# Patient Record
Sex: Male | Born: 1975 | Race: White | Hispanic: No | Smoking: Current every day smoker
Health system: Southern US, Community
[De-identification: ages and names within clinical notes are randomized; demographics above are authoritative.]

## PROBLEM LIST (undated history)

## (undated) DIAGNOSIS — F1410 Cocaine abuse, uncomplicated: Secondary | ICD-10-CM

## (undated) DIAGNOSIS — I10 Essential (primary) hypertension: Secondary | ICD-10-CM

## (undated) DIAGNOSIS — F1910 Other psychoactive substance abuse, uncomplicated: Secondary | ICD-10-CM

## (undated) HISTORY — PX: NO PAST SURGERIES: SHX2092

---
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
## 2001-01-15 HISTORY — PX: WRIST SURGERY: SHX841

## 2011-12-09 ENCOUNTER — Emergency Department: Payer: Self-pay | Admitting: Emergency Medicine

## 2011-12-09 LAB — BASIC METABOLIC PANEL
Anion Gap: 7 (ref 7–16)
Calcium, Total: 9.2 mg/dL (ref 8.5–10.1)
Chloride: 106 mmol/L (ref 98–107)
Co2: 28 mmol/L (ref 21–32)
Creatinine: 1.36 mg/dL — ABNORMAL HIGH (ref 0.60–1.30)
EGFR (African American): >60
EGFR (Non-African Amer.): >60
Glucose: 84 mg/dL (ref 65–99)
Osmolality: 281 (ref 275–301)

## 2011-12-09 LAB — CBC WITH DIFFERENTIAL/PLATELET
Basophil #: 0.1 10*3/uL (ref 0.0–0.1)
Basophil %: 0.5 %
Eosinophil #: 0 10*3/uL (ref 0.0–0.7)
Eosinophil %: 0.1 %
HCT: 43.8 % (ref 40.0–52.0)
HGB: 14.9 g/dL (ref 13.0–18.0)
Lymphocyte %: 16 %
MCH: 30.5 pg (ref 26.0–34.0)
MCHC: 34 g/dL (ref 32.0–36.0)
Neutrophil #: 8.5 10*3/uL — ABNORMAL HIGH (ref 1.4–6.5)
Neutrophil %: 78.6 %
RBC: 4.88 10*6/uL (ref 4.40–5.90)
RDW: 12.8 % (ref 11.5–14.5)
WBC: 10.8 10*3/uL — ABNORMAL HIGH (ref 3.8–10.6)

## 2011-12-09 LAB — TROPONIN I: Troponin-I: <0.02 ng/mL

## 2014-12-30 ENCOUNTER — Emergency Department
Admission: EM | Admit: 2014-12-30 | Discharge: 2014-12-30 | Disposition: A | Discharge status: Home / Self Care | Payer: BLUE CROSS/BLUE SHIELD | Attending: Emergency Medicine | Admitting: Emergency Medicine

## 2014-12-30 DIAGNOSIS — K029 Dental caries, unspecified: Secondary | ICD-10-CM | POA: Diagnosis not present

## 2014-12-30 DIAGNOSIS — K032 Erosion of teeth: Secondary | ICD-10-CM | POA: Diagnosis not present

## 2014-12-30 DIAGNOSIS — K0889 Other specified disorders of teeth and supporting structures: Secondary | ICD-10-CM | POA: Diagnosis not present

## 2014-12-30 MED ORDER — OXYCODONE-ACETAMINOPHEN 5-325 MG PO TABS: 1.0000 | ORAL_TABLET | Freq: Four times a day (QID) | ORAL | Status: DC | PRN

## 2014-12-30 MED ORDER — PENICILLIN V POTASSIUM 500 MG PO TABS: 500.0000 mg | ORAL_TABLET | Freq: Four times a day (QID) | ORAL | Status: DC

## 2014-12-30 MED ORDER — PENICILLIN V POTASSIUM 250 MG PO TABS
500.0000 mg | ORAL_TABLET | Freq: Once | ORAL | Status: AC
  Administered 2014-12-30: 500 mg via ORAL
  Filled 2014-12-30: qty 2

## 2014-12-30 MED ORDER — BUPIVACAINE HCL (PF) 0.25 % IJ SOLN
INTRAMUSCULAR | Status: AC
  Administered 2014-12-30: 30 mL
  Filled 2014-12-30: qty 30

## 2014-12-30 MED ORDER — BUPIVACAINE HCL 0.25 % IJ SOLN: 30.0000 mL | Freq: Once | INTRAMUSCULAR | Status: DC

## 2014-12-30 MED ORDER — PENICILLIN V POTASSIUM 250 MG PO TABS: 500.0000 mg | ORAL_TABLET | Freq: Four times a day (QID) | ORAL | Status: DC

## 2014-12-30 NOTE — ED Notes (Signed)
Pt in with co toothache since yest  

## 2014-12-30 NOTE — ED Provider Notes (Signed)
[facility] Emergency Department Provider Note  ____________________________________________  Time seen: Approximately 645 AM  I have reviewed the triage vital signs and the nursing notes.   HISTORY  Chief Complaint Dental Pain    HPI Karl Adkins is a 39 y.o. male with a history of substance abuse, clean for 2 years who is presenting with dental pain. He says that at 5 PM last night he began feeling pain to his left posterior mandible while chewing which has progressed to severe 10 out of 10 pain. He says that he has had dental pain in the past and is "too embarrassed to follow-up with the dentist." He says is been embarrassed because of his poor dentition. He says that he was a crack cocaine user up to about 2 years ago but has been clean since. He says he is medical as well as dental insurance to follow-up.   No past medical history on file.  There are no active problems to display for this patient.   No past surgical history on file.  No current outpatient prescriptions on file.  Allergies Review of patient's allergies indicates no known allergies.  No family history on file.  Social History Social History  Substance Use Topics  . Smoking status: Not on file  . Smokeless tobacco: Not on file  . Alcohol Use: Not on file    Review of Systems Constitutional: No fever/chills Eyes: No visual changes. ENT: No sore throat. Cardiovascular: Denies chest pain. Respiratory: Denies shortness of breath. Gastrointestinal: No abdominal pain.  No nausea, no vomiting.  No diarrhea.  No constipation. Genitourinary: Negative for dysuria. Musculoskeletal: Negative for back pain. Skin: Negative for rash. Neurological: Negative for headaches, focal weakness or numbness.  10-point ROS otherwise negative.  ____________________________________________   PHYSICAL EXAM:  VITAL SIGNS: ED Triage Vitals  Enc Vitals Group     BP 12/30/14 0533 193/137  mmHg     Pulse Rate 12/30/14 0533 104     Resp 12/30/14 0533 18     Temp 12/30/14 0533 97.7 F (36.5 C)     Temp Source 12/30/14 0533 Oral     SpO2 12/30/14 0533 99 %     Weight 12/30/14 0533 160 lb (72.576 kg)     Height 12/30/14 0533 5\' 11"  (1.803 m)     Head Cir --      Peak Flow --      Pain Score 12/30/14 0534 10     Pain Loc --      Pain Edu? --      Excl. in GC? --     Constitutional: Alert and oriented. Mild amount of distress secondary to the pain in his mouth.  Eyes: Conjunctivae are normal. PERRL. EOMI. Head: Atraumatic. Nose: No congestion/rhinnorhea. Mouth/Throat: Mucous membranes are moist.  Poor dentition with loss of many of the posterior molars both maxillary and mandibular. He has diffuse erosion of his teeth otherwise. There is no swelling or fluctuance or pus drainage visualized. He is points specifically to tooth 21 and 22 where there is tenderness. There is severe erosion along the gumline here. Neck: No stridor.   Cardiovascular: Normal rate, regular rhythm. Grossly normal heart sounds.  Good peripheral circulation. Respiratory: Normal respiratory effort.  No retractions. Lungs CTAB. Gastrointestinal: Soft and nontender. No distention.  Musculoskeletal: No lower extremity tenderness nor edema.  No joint effusions. Neurologic:  Normal speech and language. No gross focal neurologic deficits are appreciated. No gait instability. Skin:  Skin is  warm, dry and intact. No rash noted. Psychiatric: Mood and affect are normal. Speech and behavior are normal.  ____________________________________________   LABS (all labs ordered are listed, but only abnormal results are displayed)  Labs Reviewed - No data to display ____________________________________________  EKG   ____________________________________________  RADIOLOGY   ____________________________________________   PROCEDURES  Periodontal Exam NERVE BLOCK Performed by: Arelia Longest Consent: Verbal consent obtained. Required items: required blood products, implants, devices, and special equipment available Time out: Immediately prior to procedure a "time out" was called to verify the correct patient, procedure, equipment, support staff and site/side marked as required.  Indication: Dental pain  Nerve block body site: Inferior alveolar as well as mental   Preparation: Intraoral  Needle gauge: 25 G Location technique: anatomical landmarks  Local anesthetic: 0.25% Marcaine.   Anesthetic total: 2.5 mL's in the inferior alveolar block and then 1.28ml to the mental  Outcome: pain improved Patient tolerance: Patient tolerated the procedure well with no immediate complications.   ____________________________________________   INITIAL IMPRESSION / ASSESSMENT AND PLAN / ED COURSE  Pertinent labs & imaging results that were available during my care of the patient were reviewed by me and considered in my medical decision making (see chart for details).  Patient had almost immediate improvement of his pain after nerve blocks.  He is aware that he'll need to follow-up with a dentist for definitive care. He'll be discharged with pain meds as well as antibiotics. ____________________________________________   FINAL CLINICAL IMPRESSION(S) / ED DIAGNOSES  Pain secondary to dental caries.    Myrna Blazer, MD 12/30/14 [phone]

## 2015-05-02 ENCOUNTER — Emergency Department
Admission: EM | Admit: 2015-05-02 | Discharge: 2015-05-02 | Disposition: A | Discharge status: Home / Self Care | Payer: BLUE CROSS/BLUE SHIELD | Attending: Emergency Medicine | Admitting: Emergency Medicine

## 2015-05-02 ENCOUNTER — Encounter: Payer: Self-pay | Admitting: Emergency Medicine

## 2015-05-02 DIAGNOSIS — F17200 Nicotine dependence, unspecified, uncomplicated: Secondary | ICD-10-CM | POA: Diagnosis not present

## 2015-05-02 DIAGNOSIS — K029 Dental caries, unspecified: Secondary | ICD-10-CM | POA: Diagnosis not present

## 2015-05-02 DIAGNOSIS — K0889 Other specified disorders of teeth and supporting structures: Secondary | ICD-10-CM | POA: Insufficient documentation

## 2015-05-02 MED ORDER — PENICILLIN V POTASSIUM 500 MG PO TABS: 500.0000 mg | ORAL_TABLET | Freq: Four times a day (QID) | ORAL | Status: DC

## 2015-05-02 MED ORDER — OXYCODONE-ACETAMINOPHEN 5-325 MG PO TABS: 1.0000 | ORAL_TABLET | ORAL | Status: DC | PRN

## 2015-05-02 NOTE — Discharge Instructions (Signed)
Dental Caries °Dental caries is tooth decay. This decay can cause a hole in teeth (cavity) that can get bigger and deeper over time. °HOME CARE °· Brush and floss your teeth. Do this at least two times a day. °· Use a fluoride toothpaste. °· Use a mouth rinse if told by your dentist or doctor. °· Eat less sugary and starchy foods. Drink less sugary drinks. °· Avoid snacking often on sugary and starchy foods. Avoid sipping often on sugary drinks. °· Keep regular checkups and cleanings with your dentist. °· Use fluoride supplements if told by your dentist or doctor. °· Allow fluoride to be applied to teeth if told by your dentist or doctor. °  °This information is not intended to replace advice given to you by your health care provider. Make sure you discuss any questions you have with your health care provider. °  °Document Released: 10/11/2007 Document Revised: 01/22/2014 Document Reviewed: 01/04/2012 °Elsevier Interactive Patient Education ©2016 Elsevier Inc. ° °

## 2015-05-02 NOTE — ED Provider Notes (Signed)
[facility] Emergency Department Provider Note  ____________________________________________  Time seen: Approximately 8:56 PM  I have reviewed the triage vital signs and the nursing notes.   HISTORY  Chief Complaint Dental Pain    HPI Karl Adkins is a 40 y.o. male complaining of dental pain left upper and lower molars. Patient has a history of devitalized teeth which need to be extracted. Patient states pain and swelling started today. Patient stated it would be 8 days before he can see a dentist. Patient rates his pain as a 10 over 10. Patient described a pain as "sharp". No palliative measures for this complaint.  History reviewed. No pertinent past medical history.  There are no active problems to display for this patient.   History reviewed. No pertinent past surgical history.  Current Outpatient Rx  Name  Route  Sig  Dispense  Refill  . oxyCODONE-acetaminophen (ROXICET) 5-325 MG tablet   Oral   Take 1-2 tablets by mouth every 6 (six) hours as needed.   15 tablet   0   . oxyCODONE-acetaminophen (ROXICET) 5-325 MG tablet   Oral   Take 1 tablet by mouth every 4 (four) hours as needed for severe pain.   30 tablet   0   . penicillin v potassium (VEETID) 500 MG tablet   Oral   Take 1 tablet (500 mg total) by mouth 4 (four) times daily.   28 tablet   0   . penicillin v potassium (VEETID) 500 MG tablet   Oral   Take 1 tablet (500 mg total) by mouth 4 (four) times daily.   40 tablet   0     Allergies Review of patient's allergies indicates no known allergies.  No family history on file.  Social History Social History  Substance Use Topics  . Smoking status: Current Every Day Smoker  . Smokeless tobacco: None  . Alcohol Use: None    Review of Systems Constitutional: No fever/chills Eyes: No visual changes. ENT: No sore throat. Left upper and lower tooth pain  Cardiovascular: Denies chest pain. Respiratory: Denies shortness  of breath. Gastrointestinal: No abdominal pain.  No nausea, no vomiting.  No diarrhea.  No constipation. Genitourinary: Negative for dysuria. Musculoskeletal: Negative for back pain. Skin: Negative for rash. Neurological: Negative for headaches, focal weakness or numbness.   ____________________________________________   PHYSICAL EXAM:  VITAL SIGNS: ED Triage Vitals  Enc Vitals Group     BP 05/02/15 2025 142/93 mmHg     Pulse Rate 05/02/15 2025 84     Resp 05/02/15 2025 20     Temp 05/02/15 2025 98.9 F (37.2 C)     Temp Source 05/02/15 2025 Oral     SpO2 05/02/15 2025 100 %     Weight --      Height --      Head Cir --      Peak Flow --      Pain Score 05/02/15 2025 10     Pain Loc --      Pain Edu? --      Excl. in GC? --     Constitutional: Alert and oriented. Well appearing and in no acute distress. Eyes: Conjunctivae are normal. PERRL. EOMI. Head: Atraumatic. Nose: No congestion/rhinnorhea. Mouth/Throat: Mucous membranes are moist.  Oropharynx non-erythematous.Edematous gingiva with devitalized upper and lower molars  Neck: No stridor. No cervical spine tenderness to palpation. Hematological/Lymphatic/Immunilogical: No cervical lymphadenopathy. Cardiovascular: Normal rate, regular rhythm. Grossly normal heart sounds.  Good peripheral circulation. Respiratory: Normal respiratory effort.  No retractions. Lungs CTAB. Gastrointestinal: Soft and nontender. No distention. No abdominal bruits. No CVA tenderness. Musculoskeletal: No lower extremity tenderness nor edema.  No joint effusions. Neurologic:  Normal speech and language. No gross focal neurologic deficits are appreciated. No gait instability. Skin:  Skin is warm, dry and intact. No rash noted. Psychiatric: Mood and affect are normal. Speech and behavior are normal.  ____________________________________________   LABS (all labs ordered are listed, but only abnormal results are displayed)  Labs Reviewed - No  data to display ____________________________________________  EKG   ____________________________________________  RADIOLOGY   ____________________________________________   PROCEDURES  Procedure(s) performed: None  Critical Care performed: No  ____________________________________________   INITIAL IMPRESSION / ASSESSMENT AND PLAN / ED COURSE  Pertinent labs & imaging results that were available during my care of the patient were reviewed by me and considered in my medical decision making (see chart for details).  Dental pain. Patient given a list of dental clinics to follow up. Patient given a prescription for Pen-Vee K and Percocets. ____________________________________________   FINAL CLINICAL IMPRESSION(S) / ED DIAGNOSES  Final diagnoses:  Pain due to dental caries      Joni ReiningRonald K Smith, PA-C 05/02/15 2107  Jennye MoccasinBrian S Quigley, MD 05/02/15 2238

## 2015-05-02 NOTE — ED Notes (Signed)
Pt presents to ED via POV with c/o of left side dental pain. Pt states he has noticed presenting sx since today with an infection/abcess forming a couple weeks ago. Pt states pain worsening this afternoon. Pt states pain located to left upper teeth/gums. Pt denies abnormal discharge from affected area.

## 2015-05-22 ENCOUNTER — Emergency Department
Admission: EM | Admit: 2015-05-22 | Discharge: 2015-05-22 | Disposition: A | Discharge status: Home / Self Care | Payer: BLUE CROSS/BLUE SHIELD | Attending: Student | Admitting: Student

## 2015-05-22 ENCOUNTER — Encounter: Payer: Self-pay | Admitting: Emergency Medicine

## 2015-05-22 DIAGNOSIS — K047 Periapical abscess without sinus: Secondary | ICD-10-CM | POA: Insufficient documentation

## 2015-05-22 DIAGNOSIS — K0889 Other specified disorders of teeth and supporting structures: Secondary | ICD-10-CM | POA: Diagnosis present

## 2015-05-22 DIAGNOSIS — F17200 Nicotine dependence, unspecified, uncomplicated: Secondary | ICD-10-CM | POA: Insufficient documentation

## 2015-05-22 MED ORDER — PENICILLIN V POTASSIUM 500 MG PO TABS: 500.0000 mg | ORAL_TABLET | Freq: Four times a day (QID) | ORAL | Status: DC

## 2015-05-22 MED ORDER — IBUPROFEN 600 MG PO TABS: 600.0000 mg | ORAL_TABLET | Freq: Three times a day (TID) | ORAL | Status: DC | PRN

## 2015-05-22 NOTE — ED Provider Notes (Signed)
[facility] Emergency Department Provider Note   ____________________________________________  Time seen: Approximately 1:29 PM  I have reviewed the triage vital signs and the nursing notes.   HISTORY  Chief Complaint Dental Pain   HPI Karl Adkins is a 40 y.o. male is here with complaint of dental pain for a couple days. He states that 3 weeks ago he had one tooth extracted but has "a mouth full of bad teeth". He has not called a dentist to make an appointment. He states that do to financial reasons he tries to have "one tooth on a year". He denies any abdominal pain or peptic ulcer disease. Currently he rates his pain as a 9/10.He denies any fever or chills.   History reviewed. No pertinent past medical history.  There are no active problems to display for this patient.   History reviewed. No pertinent past surgical history.  Current Outpatient Rx  Name  Route  Sig  Dispense  Refill  . ibuprofen (ADVIL,MOTRIN) 600 MG tablet   Oral   Take 1 tablet (600 mg total) by mouth every 8 (eight) hours as needed.   30 tablet   0   . penicillin v potassium (VEETID) 500 MG tablet   Oral   Take 1 tablet (500 mg total) by mouth 4 (four) times daily.   40 tablet   0     Allergies Review of patient's allergies indicates no known allergies.  No family history on file.  Social History Social History  Substance Use Topics  . Smoking status: Current Every Day Smoker  . Smokeless tobacco: None  . Alcohol Use: None    Review of Systems Constitutional: No fever/chills ENT: Positive dental pain. Cardiovascular: Denies chest pain. Respiratory: Denies shortness of breath. Gastrointestinal:  No nausea, no vomiting.   Skin: Negative for rash. Neurological: Negative for headaches  10-point ROS otherwise negative.  ____________________________________________   PHYSICAL EXAM:  VITAL SIGNS: ED Triage Vitals  Enc Vitals Group     BP 05/22/15  1314 143/91 mmHg     Pulse Rate 05/22/15 1314 93     Resp 05/22/15 1314 16     Temp 05/22/15 1314 98.2 F (36.8 C)     Temp Source 05/22/15 1314 Oral     SpO2 05/22/15 1314 99 %     Weight 05/22/15 1314 165 lb (74.844 kg)     Height 05/22/15 1314  (1.803 m)     Head Cir --      Peak Flow --      Pain Score 05/22/15 1314 9     Pain Loc --      Pain Edu? --      Excl. in GC? --     Constitutional: Alert and oriented. Well appearing and in no acute distress. Eyes: Conjunctivae are normal. PERRL. EOMI. Head: Atraumatic. Nose: No congestion/rhinnorhea. Mouth/Throat: Mucous membranes are moist.  Oropharynx non-erythematous.Left upper gum anteriorly there is a small pustule present which is currently not draining. Teeth are in extremely poor hygiene and repair. There are multiple teeth extractions noted. Neck: No stridor.   Hematological/Lymphatic/Immunilogical: No cervical lymphadenopathy. Cardiovascular: Normal rate, regular rhythm. Grossly normal heart sounds.  Good peripheral circulation. Respiratory: Normal respiratory effort.  No retractions. Lungs CTAB. Musculoskeletal: Moves upper and lower extremities without any difficulty and normal gait was noted. Neurologic:  Normal speech and language. No gross focal neurologic deficits are appreciated. No gait instability. Skin:  Skin is warm, dry and intact.  No rash noted. Psychiatric: Mood and affect are normal. Speech and behavior are normal.  ____________________________________________   LABS (all labs ordered are listed, but only abnormal results are displayed)  Labs Reviewed - No data to display   PROCEDURES  Procedure(s) performed: None  Critical Care performed: No  ____________________________________________   INITIAL IMPRESSION / ASSESSMENT AND PLAN / ED COURSE  Pertinent labs & imaging results that were available during my care of the patient were reviewed by me and considered in my medical decision making  (see chart for details).  Patient was given Pen-Vee K 500 mg 4 times a day for 10 days and ibuprofen as needed for pain. He is also given information about local dental clinics that he may also qualify for. ____________________________________________   FINAL CLINICAL IMPRESSION(S) / ED DIAGNOSES  Final diagnoses:  Dental abscess      NEW MEDICATIONS STARTED DURING THIS VISIT:  Discharge Medication List as of 05/22/2015  2:06 PM    START taking these medications   Details  ibuprofen (ADVIL,MOTRIN) 600 MG tablet Take 1 tablet (600 mg total) by mouth every 8 (eight) hours as needed., Starting 05/22/2015, Until Discontinued, Print         Note:  This document was prepared using Dragon voice recognition software and may include unintentional dictation errors.    Tommi Rumpshonda L Summers, PA-C 05/22/15 1450  Gayla DossEryka A Gayle, MD 05/22/15 [phone]

## 2015-05-22 NOTE — ED Notes (Signed)
Tooth pain for couple of day pain to left upper gumline

## 2015-05-22 NOTE — Discharge Instructions (Signed)
Dental Abscess A dental abscess is pus in or around a tooth. HOME CARE  Take medicines only as told by your dentist.  If you were prescribed antibiotic medicine, finish all of it even if you start to feel better.  Rinse your mouth (gargle) often with salt water.  Do not drive or use heavy machinery, like a lawn mower, while taking pain medicine.  Do not apply heat to the outside of your mouth.  Keep all follow-up visits as told by your dentist. This is important. GET HELP IF:  Your pain is worse, and medicine does not help. GET HELP RIGHT AWAY IF:  You have a fever or chills.  Your symptoms suddenly get worse.  You have a very bad headache.  You have problems breathing or swallowing.  You have trouble opening your mouth.  You have puffiness (swelling) in your neck or around your eye.   This information is not intended to replace advice given to you by your health care provider. Make sure you discuss any questions you have with your health care provider.   Document Released: 05/18/2014 Document Reviewed: 05/18/2014 Elsevier Interactive Patient Education [employer]   Call your dentist or one of the dental clinics listed below Take medication as directed.   OPTIONS FOR DENTAL FOLLOW UP CARE  [place] [facility] - Local Safety Net Dental Clinics [email or web address]   [facility] [phone])  [facility]arl Ma [phone])  [place] [phone])  [facility] [phone])  [facility] [phone]) This clinic caters to the indigent population and is on a lottery system. Location: [employer] of Dentistry, [employer], [address], [place] Clinic Hours: Wednesdays from 6pm - 9pm, patients seen by a lottery system. For dates, call or go to [email or web address] Services: Cleanings,  fillings and simple extractions. Payment Options: DENTAL WORK IS FREE OF CHARGE. Bring proof of income or support. Best way to get seen: Arrive at 5:15 pm - this is a lottery, NOT first come/first serve, so arriving earlier will not increase your chances of being seen.     [facility] [phone] Select option 1 for emergencies   Location: [facility] of Dentistry, [place], [address] [place] Clinic Hours: No walk-ins accepted - call the day before to schedule an appointment. Check in times are 9:30 am and 1:30 pm. Services: Simple extractions, temporary fillings, pulpectomy/pulp debridement, uncomplicated abscess drainage. Payment Options: PAYMENT IS DUE AT THE TIME OF SERVICE.  Fee is usually $100-200, additional surgical procedures (e.g. abscess drainage) may be extra. Cash, checks, Visa/MasterCard accepted.  Can file Medicaid if patient is covered for dental - patient should call case worker to check. No discount for [facility] patients. Best way to get seen: MUST call the day before and get onto the schedule. Can usually be seen the next 1-2 days. No walk-ins accepted.     [facility] [phone]   Location: [facility], [address] [place] Clinic Hours: M, W, Th, F 8am or 1:30pm, Tues 9a or 1:30 - first come/first served. Services: Simple extractions, temporary fillings, uncomplicated abscess drainage.  You do not need to be an [facility] resident. Payment Options: PAYMENT IS DUE AT THE TIME OF SERVICE. Dental insurance, otherwise sliding scale - bring proof of income or support. Depending on income and treatment needed, cost is usually $50-200. Best way to get seen: Arrive early  as it is first come/first served.     [facility] [phone]   Location: [address] Clinic Hours: Mon-Thu 8a-5p Services: Most basic dental  services including extractions and fillings. Payment Options: PAYMENT IS DUE AT THE TIME OF SERVICE. Sliding scale, up to 50% off - bring proof if income or support. Medicaid with dental option accepted. Best way to get seen: Call to schedule an appointment, can usually be seen within 2 weeks OR they will try to see walk-ins - show up at 8a or 2p (you may have to wait).     [facility] [phone] [place] RESIDENTS ONLY   Location: [facility], [address], [place], [place] [phone] Clinic Hours: By appointment only. Monday - Thursday 8am-5pm, Friday 8am-12pm Services: Cleanings, fillings, extractions. Payment Options: PAYMENT IS DUE AT THE TIME OF SERVICE. Cash, Visa or MasterCard. Sliding scale - $30 minimum per service. Best way to get seen: Come in to office, complete packet and make an appointment - need proof of income or support monies for each household member and proof of [facility] [place] residence. Usually takes about a month to get in.     [facility] [phone]   Location: [address] [facility] Hours: Walk-in Urgent Care Dental Services are offered Monday-Friday mornings only. The numbers of emergencies accepted daily is limited to the number of providers available. Maximum 15 - Mondays, Wednesdays & Thursdays Maximum 10 - Tuesdays & Fridays Services: You do not need to be a [facility] [place] [facility] [place] resident to be seen for a dental emergency. Emergencies are defined as pain, swelling, abnormal bleeding, or dental trauma. Walkins will receive x-rays if needed. NOTE: Dental cleaning is not an emergency. Payment Options: PAYMENT IS DUE AT THE TIME OF SERVICE. Minimum co-pay is $40.00 for uninsured patients. Minimum co-pay is $3.00 for Medicaid with dental coverage. Dental Insurance is accepted and must be presented at time of visit. Medicare does not cover dental. Forms of payment: Cash,  credit card, checks. Best way to get seen: If not previously registered with the clinic, walk-in dental registration begins at 7:15 am and is on a first come/first serve basis. If previously registered with the clinic, call to make an appointment.     [facility] [phone] [place] RESIDENTS ONLY   Location: [address], [place], [place] Clinic Hours: Mon-Thu 10a-2p Services: Extractions only! Payment Options: FREE (donations accepted) - bring proof of income or support Best way to get seen: Call and schedule an appointment OR come at 8am on the 1st Monday of every month (except for holidays) when it is first come/first served.     [facility] [phone]   Location: [address], [place] Clinic Hours: Friday mornings Services, Payment Options, Best way to get seen: Call for info

## 2015-05-22 NOTE — ED Notes (Signed)
Patient to ER for pain to left upper dentition.

## 2016-01-16 ENCOUNTER — Emergency Department: Payer: Self-pay

## 2016-01-16 ENCOUNTER — Inpatient Hospital Stay
Admission: EM | Admit: 2016-01-16 | Discharge: 2016-01-21 | DRG: 683 | Disposition: A | Discharge status: Home / Self Care | Payer: Self-pay | Attending: Internal Medicine | Admitting: Internal Medicine

## 2016-01-16 ENCOUNTER — Encounter: Payer: Self-pay | Admitting: Emergency Medicine

## 2016-01-16 DIAGNOSIS — N170 Acute kidney failure with tubular necrosis: Principal | ICD-10-CM | POA: Diagnosis present

## 2016-01-16 DIAGNOSIS — I10 Essential (primary) hypertension: Secondary | ICD-10-CM | POA: Diagnosis present

## 2016-01-16 DIAGNOSIS — M6282 Rhabdomyolysis: Secondary | ICD-10-CM | POA: Diagnosis present

## 2016-01-16 DIAGNOSIS — F1290 Cannabis use, unspecified, uncomplicated: Secondary | ICD-10-CM | POA: Diagnosis present

## 2016-01-16 DIAGNOSIS — E860 Dehydration: Secondary | ICD-10-CM | POA: Diagnosis present

## 2016-01-16 DIAGNOSIS — E872 Acidosis, unspecified: Secondary | ICD-10-CM | POA: Diagnosis present

## 2016-01-16 DIAGNOSIS — N179 Acute kidney failure, unspecified: Secondary | ICD-10-CM | POA: Diagnosis present

## 2016-01-16 DIAGNOSIS — R4182 Altered mental status, unspecified: Secondary | ICD-10-CM | POA: Diagnosis present

## 2016-01-16 DIAGNOSIS — S0081XA Abrasion of other part of head, initial encounter: Secondary | ICD-10-CM | POA: Diagnosis present

## 2016-01-16 DIAGNOSIS — F17200 Nicotine dependence, unspecified, uncomplicated: Secondary | ICD-10-CM | POA: Diagnosis present

## 2016-01-16 DIAGNOSIS — Y92512 Supermarket, store or market as the place of occurrence of the external cause: Secondary | ICD-10-CM

## 2016-01-16 DIAGNOSIS — Y9302 Activity, running: Secondary | ICD-10-CM | POA: Diagnosis present

## 2016-01-16 DIAGNOSIS — F1410 Cocaine abuse, uncomplicated: Secondary | ICD-10-CM | POA: Diagnosis present

## 2016-01-16 DIAGNOSIS — R000 Tachycardia, unspecified: Secondary | ICD-10-CM | POA: Diagnosis present

## 2016-01-16 DIAGNOSIS — W010XXA Fall on same level from slipping, tripping and stumbling without subsequent striking against object, initial encounter: Secondary | ICD-10-CM | POA: Diagnosis present

## 2016-01-16 HISTORY — DX: Cocaine abuse, uncomplicated: F14.10

## 2016-01-16 HISTORY — DX: Essential (primary) hypertension: I10

## 2016-01-16 LAB — ACETAMINOPHEN LEVEL

## 2016-01-16 LAB — URINE DRUG SCREEN, QUALITATIVE (ARMC ONLY)
AMPHETAMINES, UR SCREEN: NOT DETECTED
BENZODIAZEPINE, UR SCRN: NOT DETECTED
Barbiturates, Ur Screen: NOT DETECTED
Cannabinoid 50 Ng, Ur ~~LOC~~: POSITIVE — AB
Cocaine Metabolite,Ur ~~LOC~~: POSITIVE — AB
MDMA (Ecstasy)Ur Screen: NOT DETECTED
METHADONE SCREEN, URINE: NOT DETECTED
Opiate, Ur Screen: NOT DETECTED
Phencyclidine (PCP) Ur S: NOT DETECTED
Tricyclic, Ur Screen: NOT DETECTED

## 2016-01-16 LAB — CBC WITH DIFFERENTIAL/PLATELET
BASOS PCT: 1 %
Basophils Absolute: 0.1 10*3/uL (ref 0–0.1)
EOS ABS: 0 10*3/uL (ref 0–0.7)
Eosinophils Relative: 0 %
HCT: 55.1 % — ABNORMAL HIGH (ref 40.0–52.0)
HEMOGLOBIN: 17.5 g/dL (ref 13.0–18.0)
Lymphocytes Relative: 14 %
Lymphs Abs: 4.4 10*3/uL — ABNORMAL HIGH (ref 1.0–3.6)
MCH: 31.2 pg (ref 26.0–34.0)
MCHC: 31.8 g/dL — AB (ref 32.0–36.0)
MCV: 97.9 fL (ref 80.0–100.0)
MONOS PCT: 6 %
Monocytes Absolute: 1.7 10*3/uL — ABNORMAL HIGH (ref 0.2–1.0)
NEUTROS PCT: 79 %
Neutro Abs: 24.7 10*3/uL — ABNORMAL HIGH (ref 1.4–6.5)
Platelets: 351 10*3/uL (ref 150–440)
RBC: 5.62 MIL/uL (ref 4.40–5.90)
RDW: 13 % (ref 11.5–14.5)
WBC: 31 10*3/uL — AB (ref 3.8–10.6)

## 2016-01-16 LAB — TROPONIN I

## 2016-01-16 LAB — CK: Total CK: 916 U/L — ABNORMAL HIGH (ref 49–397)

## 2016-01-16 LAB — BASIC METABOLIC PANEL
Anion gap: 6 (ref 5–15)
BUN: 14 mg/dL (ref 6–20)
CO2: 22 mmol/L (ref 22–32)
Calcium: 7.7 mg/dL — ABNORMAL LOW (ref 8.9–10.3)
Chloride: 109 mmol/L (ref 101–111)
Creatinine, Ser: 1.29 mg/dL — ABNORMAL HIGH (ref 0.61–1.24)
GFR calc Af Amer: >60 mL/min (ref >60)
GLUCOSE: 97 mg/dL (ref 65–99)
POTASSIUM: 3.5 mmol/L (ref 3.5–5.1)
Sodium: 137 mmol/L (ref 135–145)

## 2016-01-16 LAB — LACTIC ACID, PLASMA: LACTIC ACID, VENOUS: 3.3 mmol/L — AB (ref 0.5–1.9)

## 2016-01-16 LAB — ETHANOL: Alcohol, Ethyl (B): <5 mg/dL (ref <5)

## 2016-01-16 LAB — LACTATE DEHYDROGENASE: LDH: 315 U/L — AB (ref 98–192)

## 2016-01-16 LAB — LIPASE, BLOOD: Lipase: 15 U/L (ref 11–51)

## 2016-01-16 LAB — SALICYLATE LEVEL

## 2016-01-16 MED ORDER — STERILE WATER FOR INJECTION IV SOLN
Freq: Once | INTRAVENOUS | Status: AC
  Administered 2016-01-16: 22:00:00 via INTRAVENOUS
  Filled 2016-01-16: qty 850

## 2016-01-16 MED ORDER — SODIUM CHLORIDE 0.9 % IV BOLUS (SEPSIS)
1000.0000 mL | Freq: Once | INTRAVENOUS | Status: AC
  Administered 2016-01-16: 1000 mL via INTRAVENOUS

## 2016-01-16 MED ORDER — LORAZEPAM 2 MG/ML IJ SOLN
1.5000 mg | Freq: Once | INTRAMUSCULAR | Status: AC
  Administered 2016-01-16: 1.5 mg via INTRAVENOUS
  Filled 2016-01-16: qty 1
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
## 2016-01-16 NOTE — ED Notes (Signed)
Pt arrived with abrasion on forehead, hands/fingers, and knees. Pt admits smoking crack today.

## 2016-01-16 NOTE — ED Notes (Addendum)
Respiratory called, Trey PaulaJeff, states will come run the VBG.
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
## 2016-01-16 NOTE — ED Notes (Signed)
Pt removed IV and monitoring wires, states he want want to leave. Pt informed that he is sick and needs to be in hospital. Pt verbalize understanding.

## 2016-01-17 LAB — COMPREHENSIVE METABOLIC PANEL
ALBUMIN: 5.3 g/dL — AB (ref 3.5–5.0)
ALK PHOS: 105 U/L (ref 38–126)
ALT: 25 U/L (ref 17–63)
AST: 61 U/L — ABNORMAL HIGH (ref 15–41)
BUN: 13 mg/dL (ref 6–20)
CALCIUM: 9.7 mg/dL (ref 8.9–10.3)
Chloride: 96 mmol/L — ABNORMAL LOW (ref 101–111)
Creatinine, Ser: 1.8 mg/dL — ABNORMAL HIGH (ref 0.61–1.24)
GFR calc Af Amer: 52 mL/min — ABNORMAL LOW (ref >60)
GFR calc non Af Amer: 45 mL/min — ABNORMAL LOW (ref >60)
GLUCOSE: 254 mg/dL — AB (ref 65–99)
POTASSIUM: 4.7 mmol/L (ref 3.5–5.1)
SODIUM: 134 mmol/L — AB (ref 135–145)
Total Bilirubin: 0.6 mg/dL (ref 0.3–1.2)
Total Protein: 8.5 g/dL — ABNORMAL HIGH (ref 6.5–8.1)

## 2016-01-17 LAB — LACTIC ACID, PLASMA: Lactic Acid, Venous: 1 mmol/L (ref 0.5–1.9)

## 2016-01-17 LAB — BASIC METABOLIC PANEL WITH GFR
Anion gap: 6 (ref 5–15)
BUN: 16 mg/dL (ref 6–20)
CO2: 23 mmol/L (ref 22–32)
Calcium: 7.9 mg/dL — ABNORMAL LOW (ref 8.9–10.3)
Chloride: 111 mmol/L (ref 101–111)
Creatinine, Ser: 1.52 mg/dL — ABNORMAL HIGH (ref 0.61–1.24)
GFR calc Af Amer: >60 mL/min
GFR calc non Af Amer: 55 mL/min — ABNORMAL LOW
Glucose, Bld: 99 mg/dL (ref 65–99)
Potassium: 3.7 mmol/L (ref 3.5–5.1)
Sodium: 140 mmol/L (ref 135–145)

## 2016-01-17 LAB — CBC
HCT: 39.9 % — ABNORMAL LOW (ref 40.0–52.0)
Hemoglobin: 13.9 g/dL (ref 13.0–18.0)
MCH: 31.4 pg (ref 26.0–34.0)
MCHC: 34.8 g/dL (ref 32.0–36.0)
MCV: 90.3 fL (ref 80.0–100.0)
Platelets: 225 10*3/uL (ref 150–440)
RBC: 4.42 MIL/uL (ref 4.40–5.90)
RDW: 12.7 % (ref 11.5–14.5)
WBC: 14.8 10*3/uL — ABNORMAL HIGH (ref 3.8–10.6)

## 2016-01-17 LAB — CK: Total CK: 1929 U/L — ABNORMAL HIGH (ref 49–397)

## 2016-01-17 MED ORDER — SODIUM CHLORIDE 0.9 % IV SOLN
INTRAVENOUS | Status: DC
  Administered 2016-01-17 – 2016-01-21 (×15): via INTRAVENOUS

## 2016-01-17 MED ORDER — ACETAMINOPHEN 650 MG RE SUPP: 650.0000 mg | Freq: Four times a day (QID) | RECTAL | Status: DC | PRN

## 2016-01-17 MED ORDER — ACETAMINOPHEN 325 MG PO TABS
650.0000 mg | ORAL_TABLET | Freq: Four times a day (QID) | ORAL | Status: DC | PRN
  Administered 2016-01-18: 650 mg via ORAL
  Filled 2016-01-17: qty 2

## 2016-01-17 MED ORDER — HEPARIN SODIUM (PORCINE) 5000 UNIT/ML IJ SOLN
5000.0000 [IU] | Freq: Three times a day (TID) | INTRAMUSCULAR | Status: DC
  Administered 2016-01-17 (×3): 5000 [IU] via SUBCUTANEOUS
  Filled 2016-01-17 (×4): qty 1

## 2016-01-17 MED ORDER — HYDROCODONE-ACETAMINOPHEN 5-325 MG PO TABS
1.0000 | ORAL_TABLET | ORAL | Status: DC | PRN
  Administered 2016-01-17 – 2016-01-21 (×23): 2 via ORAL
  Filled 2016-01-17 (×23): qty 2
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
## 2016-01-18 ENCOUNTER — Inpatient Hospital Stay: Payer: Self-pay

## 2016-01-18 LAB — COMPREHENSIVE METABOLIC PANEL
ALT: 21 U/L (ref 17–63)
AST: 40 U/L (ref 15–41)
Albumin: 3.3 g/dL — ABNORMAL LOW (ref 3.5–5.0)
Alkaline Phosphatase: 54 U/L (ref 38–126)
Anion gap: 3 — ABNORMAL LOW (ref 5–15)
BUN: 13 mg/dL (ref 6–20)
CHLORIDE: 114 mmol/L — AB (ref 101–111)
CO2: 25 mmol/L (ref 22–32)
CREATININE: 1.55 mg/dL — AB (ref 0.61–1.24)
Calcium: 8.3 mg/dL — ABNORMAL LOW (ref 8.9–10.3)
GFR, EST NON AFRICAN AMERICAN: 54 mL/min — AB (ref >60)
Glucose, Bld: 104 mg/dL — ABNORMAL HIGH (ref 65–99)
POTASSIUM: 4 mmol/L (ref 3.5–5.1)
SODIUM: 142 mmol/L (ref 135–145)
Total Bilirubin: 0.3 mg/dL (ref 0.3–1.2)
Total Protein: 5.5 g/dL — ABNORMAL LOW (ref 6.5–8.1)

## 2016-01-18 LAB — CK
CK TOTAL: 1735 U/L — AB (ref 49–397)
Total CK: 1915 U/L — ABNORMAL HIGH (ref 49–397)

## 2016-01-18 LAB — CBC
HEMATOCRIT: 38.2 % — AB (ref 40.0–52.0)
HEMOGLOBIN: 13.2 g/dL (ref 13.0–18.0)
MCH: 31.4 pg (ref 26.0–34.0)
MCHC: 34.4 g/dL (ref 32.0–36.0)
MCV: 91 fL (ref 80.0–100.0)
Platelets: 169 10*3/uL (ref 150–440)
RBC: 4.19 MIL/uL — AB (ref 4.40–5.90)
RDW: 12.8 % (ref 11.5–14.5)
WBC: 12.3 10*3/uL — AB (ref 3.8–10.6)

## 2016-01-18 LAB — BASIC METABOLIC PANEL
ANION GAP: 5 (ref 5–15)
BUN: 12 mg/dL (ref 6–20)
CALCIUM: 8.7 mg/dL — AB (ref 8.9–10.3)
CHLORIDE: 109 mmol/L (ref 101–111)
CO2: 26 mmol/L (ref 22–32)
Creatinine, Ser: 1.4 mg/dL — ABNORMAL HIGH (ref 0.61–1.24)
GFR calc non Af Amer: >60 mL/min (ref >60)
GLUCOSE: 124 mg/dL — AB (ref 65–99)
POTASSIUM: 3.6 mmol/L (ref 3.5–5.1)
Sodium: 140 mmol/L (ref 135–145)

## 2016-01-18 LAB — SEDIMENTATION RATE: Sed Rate: 8 mm/hr (ref 0–15)

## 2016-01-18 MED ORDER — ENOXAPARIN SODIUM 40 MG/0.4ML ~~LOC~~ SOLN
40.0000 mg | SUBCUTANEOUS | Status: DC
  Administered 2016-01-18 – 2016-01-20 (×3): 40 mg via SUBCUTANEOUS
  Filled 2016-01-18 (×3): qty 0.4

## 2016-01-18 NOTE — Progress Notes (Signed)
Chaplain was contacted to visit with pt in room 208. A bible was requested, so the chaplain provided one.    01/18/16 1720  Clinical Encounter Type  Visited With Patient  Visit Type Initial  Referral From Nurse  Consult/Referral To Chaplain  Spiritual Encounters  Spiritual Needs Literature
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
## 2016-01-18 NOTE — Discharge Instructions (Signed)
[facility] - [place] at [facility] ° °DIET:  °Regular diet ° °DISCHARGE CONDITION:  °Stable ° °ACTIVITY:  °Activity as tolerated ° °OXYGEN:  °Home Oxygen: No. °  °Oxygen Delivery: room air ° °DISCHARGE LOCATION:  °home  ° ° °ADDITIONAL DISCHARGE INSTRUCTION: ° ° °If you experience worsening of your admission symptoms, develop shortness of breath, life threatening emergency, suicidal or homicidal thoughts you must seek medical attention immediately by calling 911 or calling your MD immediately  if symptoms less severe. ° °You Must read complete instructions/literature along with all the possible adverse reactions/side effects for all the Medicines you take and that have been prescribed to you. Take any new Medicines after you have completely understood and accpet all the possible adverse reactions/side effects.  ° °Please note ° °You were cared for by a hospitalist during your hospital stay. If you have any questions about your discharge medications or the care you received while you were in the hospital after you are discharged, you can call the unit and asked to speak with the hospitalist on call if the hospitalist that took care of you is not available. Once you are discharged, your primary care physician will handle any further medical issues. Please note that NO REFILLS for any discharge medications will be authorized once you are discharged, as it is imperative that you return to your primary care physician (or establish a relationship with a primary care physician if you do not have one) for your aftercare needs so that they can reassess your need for medications and monitor your lab values. ° ° °

## 2016-01-18 NOTE — Care Management (Signed)
Patient admitted with Rhabdomyolysis .  Patient states that he lives at home with his wife and 3 children.  States that he list his insurance 4 months ago, and became unemployed in November.  Following patient for discharge medication needs.  Discussed medication management and [facility].  Will provide application

## 2016-01-19 ENCOUNTER — Encounter: Payer: Self-pay | Admitting: *Deleted

## 2016-01-19 LAB — HEPATIC FUNCTION PANEL
ALBUMIN: 3.3 g/dL — AB (ref 3.5–5.0)
ALT: 31 U/L (ref 17–63)
AST: 71 U/L — AB (ref 15–41)
Alkaline Phosphatase: 53 U/L (ref 38–126)
BILIRUBIN TOTAL: 0.8 mg/dL (ref 0.3–1.2)
Bilirubin, Direct: 0.1 mg/dL (ref 0.1–0.5)
Indirect Bilirubin: 0.7 mg/dL (ref 0.3–0.9)
TOTAL PROTEIN: 5.7 g/dL — AB (ref 6.5–8.1)

## 2016-01-19 LAB — BASIC METABOLIC PANEL
Anion gap: 5 (ref 5–15)
BUN: 12 mg/dL (ref 6–20)
CALCIUM: 8.7 mg/dL — AB (ref 8.9–10.3)
CO2: 26 mmol/L (ref 22–32)
Chloride: 110 mmol/L (ref 101–111)
Creatinine, Ser: 1.44 mg/dL — ABNORMAL HIGH (ref 0.61–1.24)
GFR calc Af Amer: >60 mL/min (ref >60)
GFR, EST NON AFRICAN AMERICAN: 60 mL/min — AB (ref >60)
GLUCOSE: 101 mg/dL — AB (ref 65–99)
Potassium: 3.7 mmol/L (ref 3.5–5.1)
Sodium: 141 mmol/L (ref 135–145)

## 2016-01-19 LAB — CK: CK TOTAL: 5456 U/L — AB (ref 49–397)

## 2016-01-19 MED ORDER — MORPHINE SULFATE (PF) 4 MG/ML IV SOLN
2.0000 mg | Freq: Once | INTRAVENOUS | Status: AC
  Administered 2016-01-19: 2 mg via INTRAVENOUS
  Filled 2016-01-19: qty 1

## 2016-01-19 MED ORDER — LISINOPRIL 5 MG PO TABS
5.0000 mg | ORAL_TABLET | Freq: Every day | ORAL | Status: DC
Start: 2016-01-19 — End: 2016-01-21
  Administered 2016-01-19 – 2016-01-21 (×3): 5 mg via ORAL
  Filled 2016-01-19 (×3): qty 1

## 2016-01-19 NOTE — Plan of Care (Signed)
Problem: Physical Regulation: Goal: Will remain free from infection Outcome: Progressing Educated the patient on the importance of maintaining correct hand hygiene to prevent infection
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
## 2016-01-20 LAB — BASIC METABOLIC PANEL
Anion gap: 5 (ref 5–15)
BUN: 14 mg/dL (ref 6–20)
CALCIUM: 8.6 mg/dL — AB (ref 8.9–10.3)
CO2: 29 mmol/L (ref 22–32)
CREATININE: 1.37 mg/dL — AB (ref 0.61–1.24)
Chloride: 106 mmol/L (ref 101–111)
GFR calc Af Amer: >60 mL/min (ref >60)
GFR calc non Af Amer: >60 mL/min (ref >60)
GLUCOSE: 100 mg/dL — AB (ref 65–99)
Potassium: 3.2 mmol/L — ABNORMAL LOW (ref 3.5–5.1)
Sodium: 140 mmol/L (ref 135–145)

## 2016-01-20 LAB — CK: Total CK: 4789 U/L — ABNORMAL HIGH (ref 49–397)

## 2016-01-20 LAB — BLOOD GAS, VENOUS
Acid-base deficit: 6.1 mmol/L — ABNORMAL HIGH (ref 0.0–2.0)
Bicarbonate: 17.9 mmol/L — ABNORMAL LOW (ref 20.0–28.0)
O2 SAT: 90.6 %
PATIENT TEMPERATURE: 37
PH VEN: 7.37 (ref 7.250–7.430)
Patient temperature: 37
pCO2, Ven: 28 mmHg — ABNORMAL LOW (ref 44.0–60.0)
pCO2, Ven: 31 mmHg — ABNORMAL LOW (ref 44.0–60.0)
pO2, Ven: 62 mmHg — ABNORMAL HIGH (ref 32.0–45.0)

## 2016-01-20 LAB — THYROID PANEL WITH TSH
FREE THYROXINE INDEX: 2 (ref 1.2–4.9)
T3 Uptake Ratio: 31 % (ref 24–39)
T4, Total: 6.6 ug/dL (ref 4.5–12.0)
TSH: 1.59 u[IU]/mL (ref 0.450–4.500)

## 2016-01-20 LAB — ANA W/REFLEX: Anti Nuclear Antibody(ANA): NEGATIVE

## 2016-01-20 MED ORDER — POTASSIUM CHLORIDE CRYS ER 20 MEQ PO TBCR
40.0000 meq | EXTENDED_RELEASE_TABLET | Freq: Once | ORAL | Status: AC
  Administered 2016-01-20: 40 meq via ORAL
  Filled 2016-01-20: qty 2

## 2016-01-20 MED ORDER — AMLODIPINE BESYLATE 10 MG PO TABS
10.0000 mg | ORAL_TABLET | Freq: Every day | ORAL | Status: DC
  Administered 2016-01-20 – 2016-01-21 (×2): 10 mg via ORAL
  Filled 2016-01-20 (×2): qty 1

## 2016-01-20 NOTE — Plan of Care (Signed)
Problem: Physical Regulation: Goal: Ability to maintain clinical measurements within normal limits will improve Outcome: Progressing Patient educated on the reasons for his daily lab draws
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
## 2016-01-20 NOTE — Plan of Care (Signed)
Problem: Physical Regulation: Goal: Will remain free from infection Outcome: Progressing Educated patient on the need to wash hands to prevent infection

## 2016-01-21 LAB — BASIC METABOLIC PANEL
Anion gap: 5 (ref 5–15)
BUN: 12 mg/dL (ref 6–20)
CALCIUM: 8.7 mg/dL — AB (ref 8.9–10.3)
CO2: 29 mmol/L (ref 22–32)
CREATININE: 1.15 mg/dL (ref 0.61–1.24)
Chloride: 107 mmol/L (ref 101–111)
GFR calc Af Amer: >60 mL/min (ref >60)
Glucose, Bld: 96 mg/dL (ref 65–99)
Potassium: 3.5 mmol/L (ref 3.5–5.1)
Sodium: 141 mmol/L (ref 135–145)

## 2016-01-21 LAB — CK: CK TOTAL: 3511 U/L — AB (ref 49–397)

## 2016-01-21 MED ORDER — AMLODIPINE BESYLATE 10 MG PO TABS: 10.0000 mg | ORAL_TABLET | Freq: Every day | ORAL | 0 refills | Status: DC
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
## 2016-01-21 NOTE — Progress Notes (Signed)
Discharge instructions reviewed with patient.  New prescription information reviewed.  Followup appointment instructions reviewed.  Patient verbalized understanding and all questions were answered.  [facility] [place] [facility].

## 2016-01-23 ENCOUNTER — Encounter: Payer: Self-pay | Admitting: Emergency Medicine
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
## 2017-02-23 ENCOUNTER — Emergency Department
Admission: EM | Admit: 2017-02-23 | Discharge: 2017-02-23 | Disposition: A | Discharge status: Home / Self Care | Payer: BLUE CROSS/BLUE SHIELD | Attending: Emergency Medicine | Admitting: Emergency Medicine

## 2017-02-23 ENCOUNTER — Other Ambulatory Visit: Payer: Self-pay

## 2017-02-23 ENCOUNTER — Encounter: Payer: Self-pay | Admitting: Emergency Medicine

## 2017-02-23 DIAGNOSIS — Z79899 Other long term (current) drug therapy: Secondary | ICD-10-CM | POA: Insufficient documentation

## 2017-02-23 DIAGNOSIS — R350 Frequency of micturition: Secondary | ICD-10-CM | POA: Insufficient documentation

## 2017-02-23 DIAGNOSIS — F17290 Nicotine dependence, other tobacco product, uncomplicated: Secondary | ICD-10-CM | POA: Insufficient documentation

## 2017-02-23 DIAGNOSIS — K047 Periapical abscess without sinus: Secondary | ICD-10-CM | POA: Insufficient documentation

## 2017-02-23 DIAGNOSIS — I10 Essential (primary) hypertension: Secondary | ICD-10-CM | POA: Insufficient documentation

## 2017-02-23 LAB — URINALYSIS, COMPLETE (UACMP) WITH MICROSCOPIC
Bacteria, UA: NONE SEEN
Bilirubin Urine: NEGATIVE
GLUCOSE, UA: NEGATIVE mg/dL
Hgb urine dipstick: NEGATIVE
KETONES UR: NEGATIVE mg/dL
LEUKOCYTES UA: NEGATIVE
Nitrite: NEGATIVE
PH: 5 (ref 5.0–8.0)
Protein, ur: NEGATIVE mg/dL
SQUAMOUS EPITHELIAL / LPF: NONE SEEN
Specific Gravity, Urine: 1.025 (ref 1.005–1.030)

## 2017-02-23 LAB — COMPREHENSIVE METABOLIC PANEL
ALK PHOS: 64 U/L (ref 38–126)
ALT: 15 U/L — ABNORMAL LOW (ref 17–63)
AST: 21 U/L (ref 15–41)
Albumin: 4.1 g/dL (ref 3.5–5.0)
Anion gap: 9 (ref 5–15)
BUN: 18 mg/dL (ref 6–20)
CO2: 26 mmol/L (ref 22–32)
CREATININE: 1.33 mg/dL — AB (ref 0.61–1.24)
Calcium: 8.9 mg/dL (ref 8.9–10.3)
Chloride: 103 mmol/L (ref 101–111)
GFR calc Af Amer: >60 mL/min (ref >60)
Glucose, Bld: 97 mg/dL (ref 65–99)
Potassium: 3.9 mmol/L (ref 3.5–5.1)
Sodium: 138 mmol/L (ref 135–145)
Total Bilirubin: 0.6 mg/dL (ref 0.3–1.2)
Total Protein: 6.3 g/dL — ABNORMAL LOW (ref 6.5–8.1)

## 2017-02-23 LAB — CBC WITH DIFFERENTIAL/PLATELET
BASOS ABS: 0.1 10*3/uL (ref 0–0.1)
Basophils Relative: 1 %
EOS ABS: 0.2 10*3/uL (ref 0–0.7)
EOS PCT: 2 %
HCT: 42.6 % (ref 40.0–52.0)
Hemoglobin: 14.6 g/dL (ref 13.0–18.0)
Lymphocytes Relative: 33 %
Lymphs Abs: 2.7 10*3/uL (ref 1.0–3.6)
MCH: 31.7 pg (ref 26.0–34.0)
MCHC: 34.3 g/dL (ref 32.0–36.0)
MCV: 92.5 fL (ref 80.0–100.0)
Monocytes Absolute: 0.9 10*3/uL (ref 0.2–1.0)
Monocytes Relative: 11 %
Neutro Abs: 4.3 10*3/uL (ref 1.4–6.5)
Neutrophils Relative %: 53 %
PLATELETS: 222 10*3/uL (ref 150–440)
RBC: 4.61 MIL/uL (ref 4.40–5.90)
RDW: 13.2 % (ref 11.5–14.5)
WBC: 8.1 10*3/uL (ref 3.8–10.6)

## 2017-02-23 LAB — TSH: TSH: 0.35 u[IU]/mL (ref 0.350–4.500)

## 2017-02-23 MED ORDER — AMOXICILLIN 500 MG PO CAPS: 500.0000 mg | ORAL_CAPSULE | Freq: Three times a day (TID) | ORAL | 0 refills | Status: DC

## 2017-02-23 NOTE — Discharge Instructions (Signed)
Advised to follow-up with urology for definitive evaluation of  urinary discomfort and to be checked rectally.  Call Monday morning to schedule appointment.  Be advised all the lab test today were unremarkable.

## 2017-02-23 NOTE — ED Triage Notes (Signed)
Pt to ED from home c/o urinary frequency and burning x1-2 weeks.  Denies blood or discharge, or fevers at home.

## 2017-02-23 NOTE — ED Provider Notes (Signed)
[facility] Emergency Department Provider Note   ____________________________________________   First MD Initiated Contact with Patient 02/23/17 1409     (approximate)  I have reviewed the triage vital signs and the nursing notes.   HISTORY  Chief Complaint Urinary Frequency    HPI Karl Adkins is a 42 y.o. male patient complained of urinary frequency and dysuria for 1-2 weeks.  Patient denies urethral discharge.  Patient denies fever.  Patient states there is been increasing fatigue for just 3 month.  Patient denies high risk sexual activity.  No palates measured for complaint.  Patient states no PCP.   Past Medical History:  Diagnosis Date  . Cocaine abuse (HCC)   . Hypertension     Patient Active Problem List   Diagnosis Date Noted  . Rhabdomyolysis 01/16/2016  . AKI (acute kidney injury) (HCC) 01/16/2016  . Lactic acidosis 01/16/2016  . Cocaine abuse (HCC) 01/16/2016    Past Surgical History:  Procedure Laterality Date  . NO PAST SURGERIES    . TIBIA FRACTURE SURGERY Left 1996  . WRIST SURGERY Right 2003    Prior to Admission medications   Medication Sig Start Date End Date Taking? Authorizing Provider  amLODipine (NORVASC) 10 MG tablet Take 1 tablet (10 mg total) by mouth daily. 01/22/16   Katha HammingKonidena, Snehalatha, MD  amoxicillin (AMOXIL) 500 MG capsule Take 1 capsule (500 mg total) by mouth 3 (three) times daily. 02/23/17   Karl ReiningSmith, Nykerria Macconnell K, PA-C  ibuprofen (ADVIL,MOTRIN) 600 MG tablet Take 1 tablet (600 mg total) by mouth every 8 (eight) hours as needed. 05/22/15   Tommi RumpsSummers, Rhonda L, PA-C  penicillin v potassium (VEETID) 500 MG tablet Take 1 tablet (500 mg total) by mouth 4 (four) times daily. 05/22/15   Tommi RumpsSummers, Rhonda L, PA-C    Allergies Patient has no known allergies.  Family History  Family history unknown: Yes    Social History Social History   Tobacco Use  . Smoking status: Current Every Day Smoker    Types: Cigars  .  Smokeless tobacco: Former Engineer, waterUser  Substance Use Topics  . Alcohol use: Yes    Comment: rarely  . Drug use: Yes    Types: Marijuana, Cocaine    Review of Systems Constitutional: No fever/chills Eyes: No visual changes. ENT: No sore throat. Cardiovascular: Denies chest pain. Respiratory: Denies shortness of breath. Gastrointestinal: No abdominal pain.  No nausea, no vomiting.  No diarrhea.  No constipation. Genitourinary: Negative for dysuria. Musculoskeletal: Negative for back pain. Skin: Negative for rash. Neurological: Negative for headaches, focal weakness or numbness. Psychiatric:Cocaine abuse Endocrine:Hypertension  ____________________________________________   PHYSICAL EXAM:  VITAL SIGNS: ED Triage Vitals  Enc Vitals Group     BP 02/23/17 1310 (!) 147/88     Pulse Rate 02/23/17 1310 97     Resp 02/23/17 1310 16     Temp 02/23/17 1310 98.2 F (36.8 C)     Temp Source 02/23/17 1310 Oral     SpO2 02/23/17 1310 99 %     Weight 02/23/17 1311 160 lb (72.6 kg)     Height 02/23/17 1311 5\' 11"  (1.803 m)     Head Circumference --      Peak Flow --      Pain Score --      Pain Loc --      Pain Edu? --      Excl. in GC? --     Constitutional: Alert and oriented. Well appearing and in  no acute distress. ENT: Multiple dental caries, fractured tooth, and gingiva edema. Cardiovascular: Normal rate, regular rhythm. Grossly normal heart sounds.  Good peripheral circulation. Respiratory: Normal respiratory effort.  No retractions. Lungs CTAB. Gastrointestinal: Soft and nontender. No distention. No abdominal bruits. No CVA tenderness. Musculoskeletal: No lower extremity tenderness nor edema.  No joint effusions. Neurologic:  Normal speech and language. No gross focal neurologic deficits are appreciated. No gait instability. Skin:  Skin is warm, dry and intact. No rash noted. Psychiatric: Mood and affect are normal. Speech and behavior are  normal.  ____________________________________________   LABS (all labs ordered are listed, but only abnormal results are displayed)  Labs Reviewed  URINALYSIS, COMPLETE (UACMP) WITH MICROSCOPIC - Abnormal; Notable for the following components:      Result Value   Color, Urine YELLOW (*)    APPearance CLEAR (*)    All other components within normal limits  COMPREHENSIVE METABOLIC PANEL - Abnormal; Notable for the following components:   Creatinine, Ser 1.33 (*)    Total Protein 6.3 (*)    ALT 15 (*)    All other components within normal limits  CBC WITH DIFFERENTIAL/PLATELET  TSH   ____________________________________________  EKG   ____________________________________________  RADIOLOGY  ED MD interpretation:    Official radiology report(s): No results found.  ____________________________________________   PROCEDURES  Procedure(s) performed: None  Procedures  Critical Care performed: No  ____________________________________________   INITIAL IMPRESSION / ASSESSMENT AND PLAN / ED COURSE  As part of my medical decision making, I reviewed the following data within the electronic MEDICAL RECORD NUMBER    Dental pain secondary to infection.  Discussed no acute findings on labs today to suggest urinary tract infection or STD.  Patient advised to follow-up urology for definitive evaluation.  Patient to follow-up with treating dentist for dental pain.      ____________________________________________   FINAL CLINICAL IMPRESSION(S) / ED DIAGNOSES  Final diagnoses:  Urinary frequency  Dental infection     ED Discharge Orders        Ordered    amoxicillin (AMOXIL) 500 MG capsule  3 times daily     02/23/17 1607       Note:  This document was prepared using Dragon voice recognition software and may include unintentional dictation errors.    Karl Reining, PA-C 02/23/17 1611    Governor Rooks, MD 02/26/17 1135

## 2017-02-23 NOTE — ED Notes (Signed)

## 2018-03-19 ENCOUNTER — Ambulatory Visit: Payer: Self-pay

## 2018-03-25 ENCOUNTER — Other Ambulatory Visit: Payer: Self-pay

## 2018-03-25 ENCOUNTER — Ambulatory Visit: Payer: Medicaid Other | Admitting: Gerontology

## 2018-03-25 ENCOUNTER — Encounter: Payer: Self-pay | Admitting: Gerontology

## 2018-03-25 VITALS — BP 144/97 | HR 96 | Ht 70.0 in | Wt 181.0 lb

## 2018-03-25 DIAGNOSIS — R0789 Other chest pain: Secondary | ICD-10-CM

## 2018-03-25 DIAGNOSIS — I10 Essential (primary) hypertension: Secondary | ICD-10-CM

## 2018-03-25 DIAGNOSIS — R202 Paresthesia of skin: Secondary | ICD-10-CM

## 2018-03-25 DIAGNOSIS — R000 Tachycardia, unspecified: Secondary | ICD-10-CM

## 2018-03-25 DIAGNOSIS — N4889 Other specified disorders of penis: Secondary | ICD-10-CM

## 2018-03-25 DIAGNOSIS — Z7689 Persons encountering health services in other specified circumstances: Secondary | ICD-10-CM

## 2018-03-25 DIAGNOSIS — H539 Unspecified visual disturbance: Secondary | ICD-10-CM

## 2018-03-25 DIAGNOSIS — R200 Anesthesia of skin: Secondary | ICD-10-CM

## 2018-03-25 DIAGNOSIS — R42 Dizziness and giddiness: Secondary | ICD-10-CM

## 2018-03-25 MED ORDER — LISINOPRIL 20 MG PO TABS: 20.0000 mg | ORAL_TABLET | Freq: Every day | ORAL | 0 refills | Status: DC

## 2018-03-25 MED ORDER — AMLODIPINE BESYLATE 5 MG PO TABS: 5.0000 mg | ORAL_TABLET | Freq: Every day | ORAL | 0 refills | Status: DC

## 2018-03-25 NOTE — Progress Notes (Signed)
Patient ID: Karl Adkins, male   DOB: August 26, 1975, 43 y.o.   MRN: [id number]  Chief Complaint  Patient presents with  . New Patient (Initial Visit)    skin issues, prostitis     HPI Karl Adkins is a 43 y.o. male presents for initial visit and evaluation of multiple health concerns.  Hypertension: He states that he was diagnosed with hypertension in his late 20's and he takes Lisinopril 10 mg and hctz 25 mg daily. He doesn't check his blood pressure at home and denies headache.  Benign Paroxysmal Positional Vertigo (BPPV) : He reports that he was diagnosed with BPPV in 2007 after having recurrent dizziness with room spinning when he looks to his left side while lying on his back. He states that his symptoms resolved after 2  years. He reports that he started having similar dizzy spells 8 months ago when he suddenly turn his head to the left while lying on his back. He reports that he experiences the room spinning and nausea, but symptoms resolves within less than 1 hour. He denies dizziness with changing position from lying to sitting and standing. He states that dizzy symptoms persisted for couple of weeks but resolves and his last episode was 1 month ago, and dizziness lasted for 6 hours before resolving and during which he was unable to lie on his back. He denies nystagmus, vomiting, balance disorder, tinnitus and hearing loss.   He reports that during the period of December 2018 and January 2019, he engaged in unprotected anal sexual intercourse and was using illicit drugs. He was incarcerated in February of 2019 during when he started having rectal discomfort, testicular pain, dysuria and discoloration of his glans penis. He denies penile discharge, constipation, diarrhea, fever and chills. He states that he was treated with Doxycycline for 2 weeks in October of 2019 for probable epidymitis per patient.  Currently, he reports that his rectal discomfort has resolved, but he states that  he is experiencing burning numbness to his testicle,  glans penis, buttocks while sitting down, and bilateral thigh which has being going on since September 2019.  He reports that when he walks, from his lower abdominal quadrants to his thighs and perinea area feels tight and dry.  Also he reports having cold stinging sensation to his face, fingers and toes with discoloration from redness to pale color on his hands and toes which has being going on since September 2019.  Blurry vision: He reports that he experiences intermittent blurry vision to his right eye that has being going on for 6 months. He states that he has not had an eye exam in many years.  Chest Pressure: He reports intermittent, non radiating pressure to his right chest area. He states that it feels like someone is sticking a finger in his chest and chest pressure sensation occurs at rest or with any activity. He states that it has being going on for 4 months, and recently the chest pressure occurs 3-4 times in a week and lasts 15-20 seconds. He denies aggravating factor and states that it resolves without any medication or rest. He denies shortness of breath and palpitation. He states that he smokes 1 cigar in 3-4 days and he's homeless and states that he has not used cocaine or other illicit drug since February 2019. Otherwise he denies any further concerns.   Past Medical History:  Diagnosis Date  . Cocaine abuse ([place])   . Hypertension     Past Surgical History:  Procedure Laterality Date  . NO PAST SURGERIES    . TIBIA FRACTURE SURGERY Left 1996  . WRIST SURGERY Right 2003    Family History  Family history unknown: Yes    Social History Social History   Tobacco Use  . Smoking status: Current Every Day Smoker    Types: Cigars  . Smokeless tobacco: Former Network engineer Use Topics  . Alcohol use: Yes    Comment: rarely  . Drug use: Yes    Types: Marijuana, Cocaine    No Known Allergies  Current Outpatient  Medications  Medication Sig Dispense Refill  . ibuprofen (ADVIL,MOTRIN) 600 MG tablet Take 1 tablet (600 mg total) by mouth every 8 (eight) hours as needed. 30 tablet 0  . lisinopril (PRINIVIL,ZESTRIL) 20 MG tablet Take 1 tablet (20 mg total) by mouth daily. 20 tablet 0  . amLODipine (NORVASC) 5 MG tablet Take 1 tablet (5 mg total) by mouth daily. 30 tablet 0   No current facility-administered medications for this visit.     Review of Systems Review of Systems  Constitutional: Negative.   HENT: Negative.   Eyes: Positive for visual disturbance (blurry vision).  Respiratory: Negative.   Cardiovascular: Positive for chest pain (intermittent right chest pressure ).  Gastrointestinal: Negative.   Genitourinary: Positive for penile pain and testicular pain.  Musculoskeletal: Negative.   Skin: Negative.   Neurological: Positive for numbness (thighs, perineal area).  Psychiatric/Behavioral: The patient is nervous/anxious (anxious about health condition).     Blood pressure (!) 144/97, pulse 96, height 5' 10"  (1.778 m), weight 181 lb (82.1 kg), SpO2 97 %.  Physical Exam Physical Exam Constitutional:      Appearance: Normal appearance.  HENT:     Head: Normocephalic and atraumatic.     Mouth/Throat:     Mouth: Mucous membranes are moist.  Eyes:     Extraocular Movements: Extraocular movements intact.     Pupils: Pupils are equal, round, and reactive to light.  Neck:     Musculoskeletal: Normal range of motion.  Cardiovascular:     Rate and Rhythm: Tachycardia present.     Pulses: Normal pulses.     Heart sounds: Normal heart sounds.  Pulmonary:     Effort: Pulmonary effort is normal.     Breath sounds: Normal breath sounds.  Abdominal:     General: Abdomen is flat. Bowel sounds are normal.     Palpations: Abdomen is soft.     Tenderness: There is no right CVA tenderness or left CVA tenderness.  Genitourinary:    Comments: deffered per patient Musculoskeletal: Normal range  of motion.  Skin:    General: Skin is warm.  Neurological:     General: No focal deficit present.     Mental Status: He is alert and oriented to person, place, and time.  Psychiatric:        Mood and Affect: Mood normal.        Behavior: Behavior normal.        Thought Content: Thought content normal.        Judgment: Judgment normal.     Data Reviewed Medical history and routine labs were reviewed.  Assessment and Plan  1. Essential hypertension - Uncontrolled blood pressure, goal is < 140/80.  - He was advised to continue on low salt dash diet. - Exercise 30 minutes daily. - lisinopril (PRINIVIL,ZESTRIL) 10 MG tablet; Take 1 tablet (20 mg total) by mouth daily.  Dispense: 30 tablet; Refill: 0 -  amLODipine (NORVASC) 5 MG tablet; Take 1 tablet (5 mg total) by mouth daily.  Dispense: 30 tablet; Refill: 0 - Comp Met (CMET); Future  2. Numbness, tingling and color changes in peripheral extremities. - Ddx Raynaud's phenomenon, needs to keep warm - Avoid injury to digits - Avoid cocaine and smoking - Started him on amlodipine 5 mg for hypertension - Go to the nearest ED for worsening numbness or neurological changes - Sedimentation rate; Future  3. Vision changes - Ophthalmology appointment scheduled.  4. Pressure in right side of chest - He was encouraged to complete charity care application for EKG - EKG 12-Lead; Future  5. Increased heart rate - EKG 12-Lead  -TSH done on 03/25/18 was 0.211, will recheck TSH and Free T4.  6. Vertigo - He was advised to notify clinic of worsening symptoms - Go to the nearest ED for new or severe headache, double vision, trouble speaking or hearing and weakness to arms or legs. - Fall prevention advised.  7. Pain, penile - Urology appointment scheduled with Dr Madelin Headings - HIV , Syphilis screening information was provided to patient. - Urinalysis; Future - Urine Culture; Future  8. Encounter to establish care - Routine labs will be  collected. - CBC w/Diff; Future - Comp Met (CMET); Future - Lipid panel; Future - TSH; Future - HgB A1c; Future  9. Health care maintenance:  Follow up - Follow up in 1 week or if symptoms worsens.     Lexton Hidalgo E Myda Detwiler 03/26/2018, 8:24 AM

## 2018-03-26 ENCOUNTER — Encounter: Payer: Medicaid Other | Admitting: Urology

## 2018-03-26 ENCOUNTER — Other Ambulatory Visit: Payer: Self-pay

## 2018-03-26 ENCOUNTER — Other Ambulatory Visit: Payer: Self-pay | Admitting: Gerontology

## 2018-03-26 DIAGNOSIS — R7989 Other specified abnormal findings of blood chemistry: Secondary | ICD-10-CM

## 2018-03-26 DIAGNOSIS — I10 Essential (primary) hypertension: Secondary | ICD-10-CM

## 2018-03-26 DIAGNOSIS — R202 Paresthesia of skin: Secondary | ICD-10-CM

## 2018-03-26 DIAGNOSIS — R0789 Other chest pain: Secondary | ICD-10-CM

## 2018-03-26 DIAGNOSIS — R200 Anesthesia of skin: Secondary | ICD-10-CM

## 2018-03-26 DIAGNOSIS — R000 Tachycardia, unspecified: Secondary | ICD-10-CM

## 2018-03-26 LAB — COMPREHENSIVE METABOLIC PANEL
A/G RATIO: 2 (ref 1.2–2.2)
ALK PHOS: 84 IU/L (ref 39–117)
ALT: 29 IU/L (ref 0–44)
AST: 24 IU/L (ref 0–40)
Albumin: 4.5 g/dL (ref 4.0–5.0)
BILIRUBIN TOTAL: 0.3 mg/dL (ref 0.0–1.2)
BUN/Creatinine Ratio: 9 (ref 9–20)
BUN: 9 mg/dL (ref 6–24)
CHLORIDE: 102 mmol/L (ref 96–106)
CO2: 23 mmol/L (ref 20–29)
Calcium: 9.5 mg/dL (ref 8.7–10.2)
Creatinine, Ser: 0.98 mg/dL (ref 0.76–1.27)
GFR calc non Af Amer: 95 mL/min/{1.73_m2} (ref >59)
GFR, EST AFRICAN AMERICAN: 110 mL/min/{1.73_m2} (ref >59)
Globulin, Total: 2.2 g/dL (ref 1.5–4.5)
Glucose: 111 mg/dL — ABNORMAL HIGH (ref 65–99)
POTASSIUM: 3.9 mmol/L (ref 3.5–5.2)
Sodium: 141 mmol/L (ref 134–144)
TOTAL PROTEIN: 6.7 g/dL (ref 6.0–8.5)

## 2018-03-26 LAB — CBC WITH DIFFERENTIAL/PLATELET
BASOS ABS: 0.1 10*3/uL (ref 0.0–0.2)
Basos: 1 %
EOS (ABSOLUTE): 0.1 10*3/uL (ref 0.0–0.4)
Eos: 1 %
Hematocrit: 41.7 % (ref 37.5–51.0)
Hemoglobin: 14.9 g/dL (ref 13.0–17.7)
Immature Grans (Abs): 0 10*3/uL (ref 0.0–0.1)
Immature Granulocytes: 1 %
LYMPHS ABS: 2 10*3/uL (ref 0.7–3.1)
Lymphs: 30 %
MCH: 31.8 pg (ref 26.6–33.0)
MCHC: 35.7 g/dL (ref 31.5–35.7)
MCV: 89 fL (ref 79–97)
Monocytes Absolute: 0.5 10*3/uL (ref 0.1–0.9)
Monocytes: 8 %
NEUTROS ABS: 4 10*3/uL (ref 1.4–7.0)
Neutrophils: 59 %
PLATELETS: 295 10*3/uL (ref 150–450)
RBC: 4.68 x10E6/uL (ref 4.14–5.80)
RDW: 13 % (ref 11.6–15.4)
WBC: 6.6 10*3/uL (ref 3.4–10.8)

## 2018-03-26 LAB — URINALYSIS
Bilirubin, UA: NEGATIVE
Glucose, UA: NEGATIVE
Leukocytes, UA: NEGATIVE
NITRITE UA: NEGATIVE
PH UA: 5.5 (ref 5.0–7.5)
Protein, UA: NEGATIVE
RBC, UA: NEGATIVE
Specific Gravity, UA: 1.019 (ref 1.005–1.030)
UUROB: 0.2 mg/dL (ref 0.2–1.0)

## 2018-03-26 LAB — LIPID PANEL
CHOL/HDL RATIO: 4.5 ratio (ref 0.0–5.0)
Cholesterol, Total: 192 mg/dL (ref 100–199)
HDL: 43 mg/dL (ref >39)
LDL CALC: 113 mg/dL — AB (ref 0–99)
TRIGLYCERIDES: 179 mg/dL — AB (ref 0–149)
VLDL CHOLESTEROL CAL: 36 mg/dL (ref 5–40)

## 2018-03-26 LAB — HEMOGLOBIN A1C
Est. average glucose Bld gHb Est-mCnc: 108 mg/dL
Hgb A1c MFr Bld: 5.4 % (ref 4.8–5.6)

## 2018-03-26 LAB — SEDIMENTATION RATE: Sed Rate: 12 mm/hr (ref 0–15)

## 2018-03-26 LAB — TSH: TSH: 0.211 u[IU]/mL — ABNORMAL LOW (ref 0.450–4.500)

## 2018-03-26 MED ORDER — LISINOPRIL 20 MG PO TABS: 10.0000 mg | ORAL_TABLET | Freq: Every day | ORAL | 0 refills | Status: DC

## 2018-03-26 NOTE — Progress Notes (Signed)
Around 9;55 am, blood was being collected from Karl Adkins's left arm, he was alert and oriented, talking with the Phlebotomist, suddenly he stated that he was feeling dizzy and requested a cool wash cloth.  Assessing patient, he was diaphoretic, answering questions correctly, he didn't loose consciousness completely. His bp at 1003 was 70/40, 65, RR 16. EMS was notified. Blood glucose was checked and it was 87 mg/dl, he states that he didn't eat breakfast.  BP was rechecked at 1005: 91/59, 73 and 100 % on room air. He states that he doesn't do well with needles and he usually feel dizzy during blood draw. He was conscious, alert and oriented x4. 1007: BP was 97/61, 74 and 100 % on room air, RR 16 At 1013; He states that he feels better and BP was 106/69, 75, RR 17, 100 % on room air. EMS arrived and he states that he's fine and doesn't need any further medical intervention. He continued with his Urology appointment.

## 2018-03-27 ENCOUNTER — Other Ambulatory Visit: Payer: Self-pay

## 2018-03-27 LAB — T4, FREE: FREE T4: 1.66 ng/dL (ref 0.82–1.77)

## 2018-03-27 LAB — TSH: TSH: 0.315 u[IU]/mL — AB (ref 0.450–4.500)

## 2018-03-27 MED ORDER — CIPROFLOXACIN HCL 250 MG PO TABS: 250.0000 mg | ORAL_TABLET | Freq: Two times a day (BID) | ORAL | 2 refills | Status: AC

## 2018-03-28 ENCOUNTER — Other Ambulatory Visit: Payer: Self-pay

## 2018-03-28 ENCOUNTER — Ambulatory Visit: Payer: Self-pay | Admitting: Pharmacy Technician

## 2018-03-28 DIAGNOSIS — Z79899 Other long term (current) drug therapy: Secondary | ICD-10-CM

## 2018-03-28 NOTE — Progress Notes (Signed)
Completed [facility] application and contract.  Patient agreed to all terms of the [facility] contract.   Patient approved to receive medication assistance at [facility] as long as eligibility criteria continues to be met.    Provided patient with community resource material based on his particular needs.    Karl Adkins J. Ruther [facility] Care Manager [facility]  

## 2018-04-01 ENCOUNTER — Encounter: Payer: Self-pay | Admitting: Gerontology

## 2018-04-01 ENCOUNTER — Ambulatory Visit: Payer: Medicaid Other | Admitting: Gerontology

## 2018-04-01 ENCOUNTER — Institutional Professional Consult (permissible substitution): Payer: Medicaid Other | Admitting: Licensed Clinical Social Worker

## 2018-04-01 ENCOUNTER — Other Ambulatory Visit: Payer: Self-pay

## 2018-04-01 VITALS — BP 138/87 | HR 91 | Ht 70.0 in | Wt 181.1 lb

## 2018-04-01 DIAGNOSIS — H539 Unspecified visual disturbance: Secondary | ICD-10-CM

## 2018-04-01 DIAGNOSIS — R0789 Other chest pain: Secondary | ICD-10-CM

## 2018-04-01 DIAGNOSIS — I10 Essential (primary) hypertension: Secondary | ICD-10-CM

## 2018-04-01 DIAGNOSIS — E785 Hyperlipidemia, unspecified: Secondary | ICD-10-CM

## 2018-04-01 DIAGNOSIS — R202 Paresthesia of skin: Secondary | ICD-10-CM

## 2018-04-01 DIAGNOSIS — F419 Anxiety disorder, unspecified: Secondary | ICD-10-CM

## 2018-04-01 DIAGNOSIS — R200 Anesthesia of skin: Secondary | ICD-10-CM

## 2018-04-01 DIAGNOSIS — R42 Dizziness and giddiness: Secondary | ICD-10-CM

## 2018-04-01 DIAGNOSIS — R7989 Other specified abnormal findings of blood chemistry: Secondary | ICD-10-CM

## 2018-04-01 DIAGNOSIS — N4889 Other specified disorders of penis: Secondary | ICD-10-CM

## 2018-04-01 MED ORDER — LISINOPRIL 20 MG PO TABS: 10.0000 mg | ORAL_TABLET | Freq: Every day | ORAL | 2 refills | Status: AC

## 2018-04-01 MED ORDER — AMLODIPINE BESYLATE 5 MG PO TABS: 5.0000 mg | ORAL_TABLET | Freq: Every day | ORAL | 2 refills | Status: DC

## 2018-04-01 NOTE — Patient Instructions (Signed)
DASH Eating Plan  DASH stands for "Dietary Approaches to Stop Hypertension." The DASH eating plan is a healthy eating plan that has been shown to reduce high blood pressure (hypertension). It may also reduce your risk for type 2 diabetes, heart disease, and stroke. The DASH eating plan may also help with weight loss.  What are tips for following this plan?    General guidelines   Avoid eating more than 2,300 mg (milligrams) of salt (sodium) a day. If you have hypertension, you may need to reduce your sodium intake to 1,500 mg a day.   Limit alcohol intake to no more than 1 drink a day for nonpregnant women and 2 drinks a day for men. One drink equals 12 oz of beer, 5 oz of wine, or 1 oz of hard liquor.   Work with your health care provider to maintain a healthy body weight or to lose weight. Ask what an ideal weight is for you.   Get at least 30 minutes of exercise that causes your heart to beat faster (aerobic exercise) most days of the week. Activities may include walking, swimming, or biking.   Work with your health care provider or diet and nutrition specialist (dietitian) to adjust your eating plan to your individual calorie needs.  Reading food labels     Check food labels for the amount of sodium per serving. Choose foods with less than 5 percent of the Daily Value of sodium. Generally, foods with less than 300 mg of sodium per serving fit into this eating plan.   To find whole grains, look for the word "whole" as the first word in the ingredient list.  Shopping   Buy products labeled as "low-sodium" or "no salt added."   Buy fresh foods. Avoid canned foods and premade or frozen meals.  Cooking   Avoid adding salt when cooking. Use salt-free seasonings or herbs instead of table salt or sea salt. Check with your health care provider or pharmacist before using salt substitutes.   Do not fry foods. Cook foods using healthy methods such as baking, boiling, grilling, and broiling instead.   Cook with  heart-healthy oils, such as olive, canola, soybean, or sunflower oil.  Meal planning   Eat a balanced diet that includes:  ? 5 or more servings of fruits and vegetables each day. At each meal, try to fill half of your plate with fruits and vegetables.  ? Up to 6-8 servings of whole grains each day.  ? Less than 6 oz of lean meat, poultry, or fish each day. A 3-oz serving of meat is about the same size as a deck of cards. One egg equals 1 oz.  ? 2 servings of low-fat dairy each day.  ? A serving of nuts, seeds, or beans 5 times each week.  ? Heart-healthy fats. Healthy fats called Omega-3 fatty acids are found in foods such as flaxseeds and coldwater fish, like sardines, salmon, and mackerel.   Limit how much you eat of the following:  ? Canned or prepackaged foods.  ? Food that is high in trans fat, such as fried foods.  ? Food that is high in saturated fat, such as fatty meat.  ? Sweets, desserts, sugary drinks, and other foods with added sugar.  ? Full-fat dairy products.   Do not salt foods before eating.   Try to eat at least 2 vegetarian meals each week.   Eat more home-cooked food and less restaurant, buffet, and fast food.     When eating at a restaurant, ask that your food be prepared with less salt or no salt, if possible.  What foods are recommended?  The items listed may not be a complete list. Talk with your dietitian about what dietary choices are best for you.  Grains  Whole-grain or whole-wheat bread. Whole-grain or whole-wheat pasta. Brown rice. Oatmeal. Quinoa. Bulgur. Whole-grain and low-sodium cereals. Pita bread. Low-fat, low-sodium crackers. Whole-wheat flour tortillas.  Vegetables  Fresh or frozen vegetables (raw, steamed, roasted, or grilled). Low-sodium or reduced-sodium tomato and vegetable juice. Low-sodium or reduced-sodium tomato sauce and tomato paste. Low-sodium or reduced-sodium canned vegetables.  Fruits  All fresh, dried, or frozen fruit. Canned fruit in natural juice (without  added sugar).  Meat and other protein foods  Skinless chicken or turkey. Ground chicken or turkey. Pork with fat trimmed off. Fish and seafood. Egg whites. Dried beans, peas, or lentils. Unsalted nuts, nut butters, and seeds. Unsalted canned beans. Lean cuts of beef with fat trimmed off. Low-sodium, lean deli meat.  Dairy  Low-fat (1%) or fat-free (skim) milk. Fat-free, low-fat, or reduced-fat cheeses. Nonfat, low-sodium ricotta or cottage cheese. Low-fat or nonfat yogurt. Low-fat, low-sodium cheese.  Fats and oils  Soft margarine without trans fats. Vegetable oil. Low-fat, reduced-fat, or light mayonnaise and salad dressings (reduced-sodium). Canola, safflower, olive, soybean, and sunflower oils. Avocado.  Seasoning and other foods  Herbs. Spices. Seasoning mixes without salt. Unsalted popcorn and pretzels. Fat-free sweets.  What foods are not recommended?  The items listed may not be a complete list. Talk with your dietitian about what dietary choices are best for you.  Grains  Baked goods made with fat, such as croissants, muffins, or some breads. Dry pasta or rice meal packs.  Vegetables  Creamed or fried vegetables. Vegetables in a cheese sauce. Regular canned vegetables (not low-sodium or reduced-sodium). Regular canned tomato sauce and paste (not low-sodium or reduced-sodium). Regular tomato and vegetable juice (not low-sodium or reduced-sodium). Pickles. Olives.  Fruits  Canned fruit in a light or heavy syrup. Fried fruit. Fruit in cream or butter sauce.  Meat and other protein foods  Fatty cuts of meat. Ribs. Fried meat. Bacon. Sausage. Bologna and other processed lunch meats. Salami. Fatback. Hotdogs. Bratwurst. Salted nuts and seeds. Canned beans with added salt. Canned or smoked fish. Whole eggs or egg yolks. Chicken or turkey with skin.  Dairy  Whole or 2% milk, cream, and half-and-half. Whole or full-fat cream cheese. Whole-fat or sweetened yogurt. Full-fat cheese. Nondairy creamers. Whipped toppings.  Processed cheese and cheese spreads.  Fats and oils  Butter. Stick margarine. Lard. Shortening. Ghee. Bacon fat. Tropical oils, such as coconut, palm kernel, or palm oil.  Seasoning and other foods  Salted popcorn and pretzels. Onion salt, garlic salt, seasoned salt, table salt, and sea salt. Worcestershire sauce. Tartar sauce. Barbecue sauce. Teriyaki sauce. Soy sauce, including reduced-sodium. Steak sauce. Canned and packaged gravies. Fish sauce. Oyster sauce. Cocktail sauce. Horseradish that you find on the shelf. Ketchup. Mustard. Meat flavorings and tenderizers. Bouillon cubes. Hot sauce and Tabasco sauce. Premade or packaged marinades. Premade or packaged taco seasonings. Relishes. Regular salad dressings.  Where to find more information:   National Heart, Lung, and Blood Institute: www.nhlbi.nih.gov   American Heart Association: www.heart.org  Summary   The DASH eating plan is a healthy eating plan that has been shown to reduce high blood pressure (hypertension). It may also reduce your risk for type 2 diabetes, heart disease, and stroke.   With the   DASH eating plan, you should limit salt (sodium) intake to 2,300 mg a day. If you have hypertension, you may need to reduce your sodium intake to 1,500 mg a day.   When on the DASH eating plan, aim to eat more fresh fruits and vegetables, whole grains, lean proteins, low-fat dairy, and heart-healthy fats.   Work with your health care provider or diet and nutrition specialist (dietitian) to adjust your eating plan to your individual calorie needs.  This information is not intended to replace advice given to you by your health care provider. Make sure you discuss any questions you have with your health care provider.  Document Released: 12/21/2010 Document Revised: 12/26/2015 Document Reviewed: 12/26/2015  [employer] Interactive Patient Education  2019 [employer]

## 2018-04-01 NOTE — Progress Notes (Signed)
Established Patient Office Visit  Subjective:  Patient ID: Karl Adkins, male    DOB: 1975-09-18  Age: 43 y.o. MRN: [id number]  CC:  Chief Complaint  Patient presents with  . Follow-up    high blood pressure    HPI [name] presents for f/u of hypertension, BPPV, Prostatitis, Blurry vision, Chest pressure and Numbness to face.  Hypertension: He states that he takes 10 mg Lisinopril and 5 mg Amlodipine daily,and he does not check blood pressure since [facility]. He denies headache, cough,palpitation and peripheral edema.  BPPV: He states that he has not had any vertigo since last visit.  Numbness to thighs: He states that the pin and needle sensation to upper thighs has resolved. He states that he continues to feel intermittent  cold sensation and tingling to his face, fingers, toes and glans penis and the discoloration to his hand continues when hands are hanging beside his body. He states that the cold sensation comes and goes and it lasts for 5 seconds before it resolves. He states that he has 4-6 episodes daily and it's has being occurring daily since September of 2019. He reports that he was prescribed 25 mg of Elavil daily for the numbness and pain while incarcerated, documentation was not found. He states that he will stop taking Elavil.  Chest pressure: He states that he has being having chest pressure daily since his last visit and the occurrence increases with doing physical labor. He states that it's a mixture of pressure and vibration to his right chest below the breast that is non radiating. He states that it's prominent while he's shoveling, or spreading mulch. He reports that he experienced similar episode in the past 2 days when he engaged in physical labor. He states that the episodes lasts the entire day and resolved later in the evening. He reports that pressure and vibration to his right chest is aggraveted when he engages in physical labor,  riding bicycle, but it can occur at rest and it resolves within 5 minutes. He denies taking any medication, shortness of breath, diaphoresis and palpitation. He states that he has not had the chest vibration prior to office visit today.  Blurry vision to right eye: He has Ophthalmology appointment on 4/ 20/2020  Prostatitis: He reports taking ciprofloxacin 250 mg bid for 60 days prescribed by [name].  Anxiety: He reports that he is anxious about being homeless and takes 25 mg Elavil as needed for numbness and pain and will follow up with [name].  Abnormal TSH: His TSH on 03/27/18 was  0.315,free T 4 was normal. He denies peripheral tremor, fatigue, weight loss, abnormal heart rhythm, tachycardia and panic attacks. Otherwise he denies fever, chills and no further concerns.      Past Medical History:  Diagnosis Date  . Cocaine abuse (HCC)   . Hypertension     Past Surgical History:  Procedure Laterality Date  . NO PAST SURGERIES    . TIBIA FRACTURE SURGERY Left 1996  . WRIST SURGERY Right 2003    Family History  Family history unknown: Yes    Social History   Socioeconomic History  . Marital status: Legally Separated    Spouse name: Not on file  . Number of children: Not on file  . Years of education: Not on file  . Highest education level: Not on file  Occupational History  . Not on file  Social Needs  . Financial resource strain: Not  on file  . Food insecurity:    Worry: Not on file    Inability: Not on file  . Transportation needs:    Medical: Not on file    Non-medical: Not on file  Tobacco Use  . Smoking status: Current Every Day Smoker    Types: Cigars  . Smokeless tobacco: Former Engineer, water and Sexual Activity  . Alcohol use: Yes    Comment: rarely  . Drug use: Yes    Types: Marijuana, Cocaine  . Sexual activity: Not on file  Lifestyle  . Physical activity:    Days per week: Not on file    Minutes per session: Not on file  . Stress: Not  on file  Relationships  . Social connections:    Talks on phone: Not on file    Gets together: Not on file    Attends religious service: Not on file    Active member of club or organization: Not on file    Attends meetings of clubs or organizations: Not on file    Relationship status: Not on file  . Intimate partner violence:    Fear of current or ex partner: Not on file    Emotionally abused: Not on file    Physically abused: Not on file    Forced sexual activity: Not on file  Other Topics Concern  . Not on file  Social History Narrative   ** Merged History Encounter **        Outpatient Medications Prior to Visit  Medication Sig Dispense Refill  . ciprofloxacin (CIPRO) 250 MG tablet Take 1 tablet (250 mg total) by mouth 2 (two) times daily for 30 days. 60 tablet 2  . ibuprofen (ADVIL,MOTRIN) 600 MG tablet Take 1 tablet (600 mg total) by mouth every 8 (eight) hours as needed. 30 tablet 0  . amLODipine (NORVASC) 5 MG tablet Take 1 tablet (5 mg total) by mouth daily. 30 tablet 0  . lisinopril (PRINIVIL,ZESTRIL) 20 MG tablet Take 0.5 tablets (10 mg total) by mouth daily. 30 tablet 0   No facility-administered medications prior to visit.     No Known Allergies  ROS Review of Systems  Constitutional: Negative.   Eyes: Positive for visual disturbance (blurry vision right eye).  Respiratory: Positive for chest tightness (chest vibration and pressure).   Cardiovascular: Negative.   Endocrine: Negative.   Genitourinary: Negative.   Musculoskeletal: Negative.   Skin: Negative.   Neurological: Positive for numbness (abdomen, ). Negative for tremors.  Psychiatric/Behavioral: The patient is nervous/anxious.       Objective:    Physical Exam  Constitutional: He is oriented to person, place, and time.  HENT:  Head: Normocephalic.  Eyes: Pupils are equal, round, and reactive to light. EOM are normal.  Neck: Normal range of motion.  Cardiovascular: Normal rate, regular rhythm  and normal heart sounds.  Pulmonary/Chest: Effort normal and breath sounds normal.  Abdominal: Soft. Bowel sounds are normal.  Musculoskeletal: Normal range of motion.  Neurological: He is alert and oriented to person, place, and time.  Skin: Skin is warm.  Psychiatric: He has a normal mood and affect. His behavior is normal. Judgment and thought content normal.    BP 138/87 (BP Location: Left Arm, Patient Position: Sitting, Cuff Size: Large)   Pulse 91   Ht 5\' 10"  (1.778 m)   Wt 181 lb 1.6 oz (82.1 kg)   SpO2 97%   BMI 25.99 kg/m  Wt Readings from Last 3 Encounters:  04/01/18 181 lb 1.6 oz (82.1 kg)  03/25/18 181 lb (82.1 kg)  02/23/17 160 lb (72.6 kg)     Health Maintenance Due  Topic Date Due  . HIV Screening  06/23/1991  . TETANUS/TDAP  06/23/1995  . INFLUENZA VACCINE  08/15/2017    There are no preventive care reminders to display for this patient.  Lab Results  Component Value Date   TSH 0.315 (L) 03/26/2018   TSH will be rechecked in 4-6 weeks.  Lab Results  Component Value Date   WBC 6.6 03/25/2018   HGB 14.9 03/25/2018   HCT 41.7 03/25/2018   MCV 89 03/25/2018   PLT 295 03/25/2018   Lab Results  Component Value Date   NA 141 03/25/2018   K 3.9 03/25/2018   CO2 23 03/25/2018   GLUCOSE 111 (H) 03/25/2018   BUN 9 03/25/2018   CREATININE 0.98 03/25/2018   BILITOT 0.3 03/25/2018   ALKPHOS 84 03/25/2018   AST 24 03/25/2018   ALT 29 03/25/2018   PROT 6.7 03/25/2018   ALBUMIN 4.5 03/25/2018   CALCIUM 9.5 03/25/2018   ANIONGAP 9 02/23/2017   Lab Results  Component Value Date   CHOL 192 03/25/2018   Lab Results  Component Value Date   HDL 43 03/25/2018   Lab Results  Component Value Date   LDLCALC 113 (H) 03/25/2018   He was encouraged to continue on low fat low cholesterol diet, lipid panel will be rechecked in 3 months.  Lab Results  Component Value Date   TRIG 179 (H) 03/25/2018   Lab Results  Component Value Date   CHOLHDL 4.5  03/25/2018   Lab Results  Component Value Date   HGBA1C 5.4 03/25/2018      Assessment & Plan:     1. Essential hypertension . -Low salt DASH diet . Take medications regularly on time . Exercise regularly as tolerated . Check blood pressure at least once a week at home or a nearby pharmacy and record . Goal is less than 140/90 and normal blood pressure is 120/80  - amLODipine (NORVASC) 5 MG tablet; Take 1 tablet (5 mg total) by mouth daily.  Dispense: 30 tablet; Refill: 2 - lisinopril (PRINIVIL,ZESTRIL) 20 MG tablet; Take 0.5 tablets (10 mg total) by mouth daily.  Dispense: 30 tablet; Refill: 2  2. Abnormal TSH - Subclinical hyperthyroidism, he was advised to notify clinic for symptoms of hyperthyroidism - TSH; will be rechecked in 3 months.  3. Elevated lipids -He was encouraged to continue on low-fat low-cholesterol diet. - Lipid panel; will be rechecked in 3 months.  4. Vision changes -He was encouraged to follow-up with ophthalmology appointment in April.  5. Pressure in right side of chest -He was advised to go to the nearest emergency room for worsening or lingering pressure to the chest -He was encouraged to follow-up with an EKG - EKG 12-Lead  6. Numbness and tingling - Ddx Fibromyalgia - Ambulatory referral to Rheumatology  7. Anxiety -He was encouraged to follow-up with Ms. Lodema Hong -He was advised to discontinue taking 25 mg amitriptyline.  8. Vertigo -He was advised to notify clinic for any episode of vertigo  9. Pain, penile - He will continue on Ciprofloxacin 250 mg bid for 60 days. - He will follow up with [name].   Follow-up: Return in about 23 days (around 04/24/2018), or if symptoms worsen or fail to improve.    Emalina Dubreuil Trellis Paganini, NP

## 2018-04-02 ENCOUNTER — Other Ambulatory Visit: Payer: Self-pay

## 2018-04-02 DIAGNOSIS — R0789 Other chest pain: Secondary | ICD-10-CM

## 2018-04-08 ENCOUNTER — Institutional Professional Consult (permissible substitution): Payer: Medicaid Other | Admitting: Licensed Clinical Social Worker
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
## 2018-04-24 ENCOUNTER — Ambulatory Visit: Payer: Medicaid Other | Admitting: Ophthalmology

## 2018-05-21 ENCOUNTER — Other Ambulatory Visit: Payer: Medicaid Other

## 2018-05-28 ENCOUNTER — Encounter: Payer: Medicaid Other | Admitting: Urology
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
## 2018-07-14 ENCOUNTER — Other Ambulatory Visit: Payer: Self-pay | Admitting: Gerontology

## 2018-07-14 DIAGNOSIS — I10 Essential (primary) hypertension: Secondary | ICD-10-CM

## 2018-09-08 ENCOUNTER — Other Ambulatory Visit: Payer: Self-pay | Admitting: Gerontology

## 2018-12-08 ENCOUNTER — Ambulatory Visit: Payer: Medicaid Other | Admitting: Gerontology

## 2018-12-08 ENCOUNTER — Other Ambulatory Visit: Payer: Self-pay

## 2019-01-06 ENCOUNTER — Emergency Department
Admission: EM | Admit: 2019-01-06 | Discharge: 2019-01-06 | Disposition: A | Discharge status: Home / Self Care | Payer: Medicaid Other | Attending: Emergency Medicine | Admitting: Emergency Medicine

## 2019-01-06 ENCOUNTER — Other Ambulatory Visit: Payer: Self-pay

## 2019-01-06 DIAGNOSIS — F1510 Other stimulant abuse, uncomplicated: Secondary | ICD-10-CM | POA: Insufficient documentation

## 2019-01-06 DIAGNOSIS — F1410 Cocaine abuse, uncomplicated: Secondary | ICD-10-CM | POA: Insufficient documentation

## 2019-01-06 DIAGNOSIS — Z79899 Other long term (current) drug therapy: Secondary | ICD-10-CM | POA: Insufficient documentation

## 2019-01-06 DIAGNOSIS — L0231 Cutaneous abscess of buttock: Secondary | ICD-10-CM

## 2019-01-06 DIAGNOSIS — F17290 Nicotine dependence, other tobacco product, uncomplicated: Secondary | ICD-10-CM | POA: Insufficient documentation

## 2019-01-06 DIAGNOSIS — F1210 Cannabis abuse, uncomplicated: Secondary | ICD-10-CM | POA: Insufficient documentation

## 2019-01-06 DIAGNOSIS — I10 Essential (primary) hypertension: Secondary | ICD-10-CM | POA: Insufficient documentation

## 2019-01-06 DIAGNOSIS — F1910 Other psychoactive substance abuse, uncomplicated: Secondary | ICD-10-CM | POA: Insufficient documentation

## 2019-01-06 HISTORY — DX: Other psychoactive substance abuse, uncomplicated: F19.10

## 2019-01-06 MED ORDER — DOXYCYCLINE HYCLATE 100 MG PO CAPS: 100.0000 mg | ORAL_CAPSULE | Freq: Two times a day (BID) | ORAL | 0 refills | Status: AC

## 2019-01-06 MED ORDER — CEPHALEXIN 500 MG PO CAPS
500.0000 mg | ORAL_CAPSULE | Freq: Once | ORAL | Status: AC
  Administered 2019-01-06: 500 mg via ORAL
  Filled 2019-01-06: qty 1

## 2019-01-06 MED ORDER — CEPHALEXIN 500 MG PO CAPS: 500.0000 mg | ORAL_CAPSULE | Freq: Four times a day (QID) | ORAL | 0 refills | Status: DC

## 2019-01-06 MED ORDER — DOXYCYCLINE HYCLATE 100 MG PO TABS
100.0000 mg | ORAL_TABLET | Freq: Once | ORAL | Status: AC
  Administered 2019-01-06: 100 mg via ORAL
  Filled 2019-01-06: qty 1

## 2019-01-06 MED ORDER — DEXAMETHASONE 10 MG/ML FOR PEDIATRIC ORAL USE
10.0000 mg | Freq: Once | INTRAMUSCULAR | Status: AC
  Administered 2019-01-06: 10 mg via ORAL
  Filled 2019-01-06: qty 1

## 2019-01-06 MED ORDER — LIDOCAINE-PRILOCAINE 2.5-2.5 % EX CREA
TOPICAL_CREAM | CUTANEOUS | Status: AC
  Filled 2019-01-06: qty 5

## 2019-01-06 NOTE — ED Notes (Signed)
Pt sitting in exam room with no distress noted; reports abscess to left buttock for few days; denies hx of same; denies any fever or accomp symptoms

## 2019-01-06 NOTE — ED Triage Notes (Addendum)
Patient c/o abscess to left buttock.

## 2019-01-06 NOTE — Discharge Instructions (Addendum)
As we discussed, since your abscess is already draining and has a large open hole in the middle of it, we decided to hold off on cutting it open further.  Please take the full 10-day course of both prescribed antibiotics.  Apply a small amount of the cream provided on the abscess to help with pain control and use over-the-counter pain medication such as ibuprofen and Tylenol.  It is important that you soak the wound and try to get it to express as much pus as possible so that it can start healing from the inside out.  Follow-up with Dr. Peyton Najjar or one of his colleagues in the surgery clinic for a wound recheck and especially if the wound is getting worse in spite of the medication, or return to the emergency department with new or worsening symptoms.

## 2019-01-06 NOTE — ED Provider Notes (Signed)
[facility] Emergency Department Provider Note  ____________________________________________   First MD Initiated Contact with Patient 01/06/19 [phone]     (approximate)  I have reviewed the triage vital signs and the nursing notes.   HISTORY  Chief Complaint Abscess    HPI Peggye FothergillJoshua A Adkins is a 43 y.o. male who admits to history of polysubstance abuse, currently IV methamphetamines and smoking heroin.  He presents for evaluation of about 2 to 3 days of worsening swelling, redness, and pain in his left buttock.  He also just discovered that he has a small red knot  near the top of his right buttock but the left buttock is the main issue.  He took 3 doses of amoxicillin provided to him by a friend but that has not helped.  He has been trying to apply some sort of over-the-counter ointment that is supposed to help with boils, but that has not helped either.  The pain is severe and made worse by any amount of movement or pressure on the area.  He has not had any fever or chills.  He denies cough, shortness of breath, nausea, vomiting, and abdominal pain.  He has no testicular scrotal pain and no pain around his anus.  No pain with defecation except for the pain of sitting on the wound.        Past Medical History:  Diagnosis Date  . Cocaine abuse (HCC)   . Hypertension   . Polysubstance abuse [facility](HCC)     Patient Active Problem List   Diagnosis Date Noted  . Rhabdomyolysis 01/16/2016  . AKI (acute kidney injury) (HCC) 01/16/2016  . Lactic acidosis 01/16/2016  . Cocaine abuse (HCC) 01/16/2016    Past Surgical History:  Procedure Laterality Date  . NO PAST SURGERIES    . TIBIA FRACTURE SURGERY Left 1996  . WRIST SURGERY Right 2003    Prior to Admission medications   Medication Sig Start Date End Date Taking? Authorizing Provider  amLODipine (NORVASC) 5 MG tablet TAKE ONE TABLET BY MOUTH EVERY DAY 07/14/18   Iloabachie, Chioma E, NP  cephALEXin (KEFLEX)  500 MG capsule Take 1 capsule (500 mg total) by mouth 4 (four) times daily. 01/06/19   Loleta RoseForbach, Masey Scheiber, MD  doxycycline (VIBRAMYCIN) 100 MG capsule Take 1 capsule (100 mg total) by mouth 2 (two) times daily for 10 days. 01/06/19 01/16/19  Loleta RoseForbach, Matilde Pottenger, MD  ibuprofen (ADVIL,MOTRIN) 600 MG tablet Take 1 tablet (600 mg total) by mouth every 8 (eight) hours as needed. 05/22/15   Tommi RumpsSummers, Rhonda L, PA-C  lisinopril (PRINIVIL,ZESTRIL) 20 MG tablet Take 0.5 tablets (10 mg total) by mouth daily. 04/01/18   Iloabachie, Chioma E, NP    Allergies Patient has no known allergies.  Family History  Family history unknown: Yes    Social History Social History   Tobacco Use  . Smoking status: Current Every Day Smoker    Types: Cigars  . Smokeless tobacco: Former Engineer, waterUser  Substance Use Topics  . Alcohol use: Yes    Comment: rarely  . Drug use: Yes    Types: Marijuana, Cocaine, Methamphetamines, IV    Review of Systems Constitutional: No fever/chills Respiratory: Denies shortness of breath. Gastrointestinal: No abdominal pain.  No nausea, no vomiting.   Genitourinary: Negative for dysuria. Musculoskeletal: Severe pain in left buttock. Integumentary: Left buttock abscess and another developing abscess above the right buttock.   ____________________________________________   PHYSICAL EXAM:  VITAL SIGNS: ED Triage Vitals  Enc Vitals Group  BP 01/06/19 0406 (!) 138/97     Pulse Rate 01/06/19 0406 (!) 105     Resp 01/06/19 0406 18     Temp 01/06/19 0406 (!) 97.5 F (36.4 C)     Temp Source 01/06/19 0406 Oral     SpO2 01/06/19 0406 100 %     Weight 01/06/19 0404 72.6 kg (160 lb)     Height 01/06/19 0404 1.778 m (5\' 10" )     Head Circumference --      Peak Flow --      Pain Score 01/06/19 0404 7     Pain Loc --      Pain Edu? --      Excl. in GC? --     Constitutional: Alert and oriented.  Appears uncomfortable due to pain. Eyes: Conjunctivae are normal.  Head: Atraumatic. Nose: No  congestion/rhinnorhea. Mouth/Throat: Chronically poor dentition. Neck: No stridor.  No meningeal signs.   Cardiovascular: Mild tachycardia, regular rhythm. Good peripheral circulation. Grossly normal heart sounds. Respiratory: Normal respiratory effort.  No retractions. Neurologic:  Normal speech and language. No gross focal neurologic deficits are appreciated.  Skin:  Skin is warm and dry.  He has an area of erythema on his left lower buttock that is about 7 cm in diameter.  There is a central area that is about 3 cm in diameter that is indurated but not fluctuant.  In the center there is approximately 1 cm open wound that is draining purulent material.  He said a large volume has drained over the course of the day.  There is no spreading of cellulitis or induration towards the rectum and there is no involvement of the perineum or scrotum and no crepitus of the area.  He also has a small, approximately 2 cm in diameter raised area above the right buttock (but not in a pilonidal distribution) of an erythematous and indurated lesion that appears to be an early developing abscess but without any fluctuance. Psychiatric: Mood and affect are normal. Speech and behavior are normal.  ____________________________________________   LABS (all labs ordered are listed, but only abnormal results are displayed)  Labs Reviewed - No data to display ____________________________________________  EKG  Medication for EKG ____________________________________________  RADIOLOGY I, 01/08/19, personally viewed and evaluated these images (plain radiographs) as part of my medical decision making, as well as reviewing the written report by the radiologist.  ED MD interpretation: No indication for emergent imaging  Official radiology report(s): No results found.  ____________________________________________   PROCEDURES   Procedure(s) performed (including Critical  Care):  Procedures   ____________________________________________   INITIAL IMPRESSION / MDM / ASSESSMENT AND PLAN / ED COURSE  As part of my medical decision making, I reviewed the following data within the electronic MEDICAL RECORD NUMBER Nursing notes reviewed and incorporated, Notes from prior ED visits and [place] Controlled Substance Database   The patient has a large cutaneous abscess on his left buttock with some surrounding cellulitis but no fluctuance currently and a large central draining hole in the middle of the abscess.  I have seen similar lesions in the past for which surgery has been consulted and they have suggested medication management, sitz bath's, but no further incision given the size of the draining wound and the fact there is no longer any fluctuance.  He has no spreading cellulitis except for the well-circumscribed area mentioned above.  We discussed all of these things and the risks and benefits of additional incision and drainage versus  conservative management and he agrees with the plan to use sitz baths.  I am giving him some EMLA ointment for topical analgesia.  He understands that I cannot prescribe narcotics given his admitted polysubstance abuse and I encouraged use of ibuprofen and Tylenol.  I am giving him a dose of Decadron 10 mg by mouth to help with the inflammation since he will be on both Keflex and doxycycline as prescribed and listed below and he is getting a dose of Keflex 500 mg p.o. and doxycycline 100 mg p.o. prior to discharge.  I gave him strict return precautions either to the emergency department or to the surgery clinic if he is not improving within a few days.  He understands and agrees with the plan.          ____________________________________________  FINAL CLINICAL IMPRESSION(S) / ED DIAGNOSES  Final diagnoses:  Abscess of buttock, left     MEDICATIONS GIVEN DURING THIS VISIT:  Medications  doxycycline (VIBRA-TABS) tablet 100 mg (has no  administration in time range)  cephALEXin (KEFLEX) capsule 500 mg (has no administration in time range)  lidocaine-prilocaine (EMLA) cream (has no administration in time range)  dexamethasone (DECADRON) 10 MG/ML injection for Pediatric ORAL use 10 mg (has no administration in time range)     ED Discharge Orders         Ordered    doxycycline (VIBRAMYCIN) 100 MG capsule  2 times daily     01/06/19 0557    cephALEXin (KEFLEX) 500 MG capsule  4 times daily,   Status:  Discontinued     01/06/19 0557    cephALEXin (KEFLEX) 500 MG capsule  4 times daily     01/06/19 9702          *Please note:  Karl Adkins was evaluated in Emergency Department on 01/06/2019 for the symptoms described in the history of present illness. He was evaluated in the context of the global COVID-19 pandemic, which necessitated consideration that the patient might be at risk for infection with the SARS-CoV-2 virus that causes COVID-19. Institutional protocols and algorithms that pertain to the evaluation of patients at risk for COVID-19 are in a state of rapid change based on information released by regulatory bodies including the CDC and federal and state organizations. These policies and algorithms were followed during the patient's care in the ED.  Some ED evaluations and interventions may be delayed as a result of limited staffing during the pandemic.*  Note:  This document was prepared using Dragon voice recognition software and may include unintentional dictation errors.   Hinda Kehr, MD 01/06/19 [phone]

## 2019-06-16 ENCOUNTER — Ambulatory Visit: Payer: Medicaid Other

## 2020-01-25 ENCOUNTER — Telehealth: Payer: Self-pay | Admitting: Pharmacy Technician

## 2020-01-25 NOTE — Telephone Encounter (Signed)
Patient failed to provide 2021 proof of income.  No additional medication assistance will be provided by [facility] without the required proof of income documentation.  Patient notified by letter.  Karl Adkins J. Tylesha Gibeault Care Manager [facility]   [address] [place], [place]  [address]     This is to inform you that you are no longer eligible to receive medication assistance at [facility].  The reason(s) are:    _____Your total gross monthly household income exceeds 250% of the Federal Poverty Level.   _____Tangible assets (savings, checking, stocks/bonds, pension, retirement, etc.) exceeds our limit  _____You are eligible to receive benefits from Medicaid, [facility] or HIV Medication              Assistance Program _____You are eligible to receive benefits from a Medicare Part "D" plan _____You have prescription insurance  _____You are not an [place] resident __X__Failure to provide all requested proof of income information for 2021.    Medication assistance will resume once all requested financial information has been returned to our clinic.  If you have questions, please contact our clinic at [phone].    Thank you,  [facility] 

## 2022-08-25 ENCOUNTER — Ambulatory Visit (INDEPENDENT_AMBULATORY_CARE_PROVIDER_SITE_OTHER): Payer: Medicaid Other

## 2022-08-25 ENCOUNTER — Ambulatory Visit
Admission: EM | Admit: 2022-08-25 | Discharge: 2022-08-25 | Disposition: A | Discharge status: Home / Self Care | Payer: Medicaid Other | Source: Home / Self Care

## 2022-08-25 DIAGNOSIS — J189 Pneumonia, unspecified organism: Secondary | ICD-10-CM | POA: Diagnosis not present

## 2022-08-25 DIAGNOSIS — R058 Other specified cough: Secondary | ICD-10-CM

## 2022-08-25 DIAGNOSIS — I10 Essential (primary) hypertension: Secondary | ICD-10-CM

## 2022-08-25 MED ORDER — AZITHROMYCIN 250 MG PO TABS: 250.0000 mg | ORAL_TABLET | Freq: Every day | ORAL | 0 refills | Status: DC

## 2022-08-25 MED ORDER — AMOXICILLIN-POT CLAVULANATE 875-125 MG PO TABS: 1.0000 | ORAL_TABLET | Freq: Two times a day (BID) | ORAL | 0 refills | Status: DC

## 2022-08-25 NOTE — Discharge Instructions (Addendum)
Take the Augmentin and Zithromax as directed.  You need to have a repeat chest x-ray in 3 to 4 weeks.  Go to the emergency department if you have worsening symptoms.   Your blood pressure is elevated today at 137/90.  Please have this rechecked by your primary care provider in 2-4 weeks.

## 2022-08-25 NOTE — ED Provider Notes (Signed)
Karl Adkins    CSN: [id number] Arrival date & time: 08/25/22  0845      History   Chief Complaint Chief Complaint  Patient presents with   URI    HPI Karl Adkins is a 47 y.o. male.  Patient presents with 1 week history of congestion, cough, body aches, fatigue.  His cough is getting worse and has become productive in the last 2 days.  He has been treating his symptoms with Mucinex and an albuterol inhaler that was previously prescribed.  No fever, shortness of breath, chest pain, or other symptoms.  His medical history includes hypertension.  The history is provided by the patient and medical records.    Past Medical History:  Diagnosis Date   Cocaine abuse (HCC)    Hypertension    Polysubstance abuse [facility])     Patient Active Problem List   Diagnosis Date Noted   Rhabdomyolysis 01/16/2016   AKI (acute kidney injury) (HCC) 01/16/2016   Lactic acidosis 01/16/2016   Cocaine abuse (HCC) 01/16/2016    Past Surgical History:  Procedure Laterality Date   NO PAST SURGERIES     TIBIA FRACTURE SURGERY Left 1996   WRIST SURGERY Right 2003       Home Medications    Prior to Admission medications   Medication Sig Start Date End Date Taking? Authorizing Provider  amoxicillin-clavulanate (AUGMENTIN) 875-125 MG tablet Take 1 tablet by mouth every 12 (twelve) hours. 08/25/22  Yes Mickie Bail, NP  atorvastatin (LIPITOR) 40 MG tablet Take 1 tablet by mouth daily. 08/03/22 08/03/23 Yes [provider]  azithromycin (ZITHROMAX) 250 MG tablet Take 1 tablet (250 mg total) by mouth daily. Take first 2 tablets together, then 1 every day until finished. 08/25/22  Yes Mickie Bail, NP  tamsulosin (FLOMAX) 0.4 MG CAPS capsule Take 1 capsule by mouth daily. 08/02/22 08/02/23 Yes [provider]  amLODipine (NORVASC) 5 MG tablet TAKE ONE TABLET BY MOUTH EVERY DAY Patient not taking: Reported on 08/25/2022 07/14/18   Iloabachie, Chioma E, NP  hydrochlorothiazide  (HYDRODIURIL) 25 MG tablet Take 1 tablet by mouth daily. Patient not taking: Reported on 08/25/2022 07/12/22   [provider]  ibuprofen (ADVIL,MOTRIN) 600 MG tablet Take 1 tablet (600 mg total) by mouth every 8 (eight) hours as needed. 05/22/15   Tommi Rumps, PA-C  lisinopril (PRINIVIL,ZESTRIL) 20 MG tablet Take 0.5 tablets (10 mg total) by mouth daily. 04/01/18   Iloabachie, Chioma E, NP    Family History Family History  Family history unknown: Yes    Social History Social History   Tobacco Use   Smoking status: Every Day    Types: Cigarettes   Smokeless tobacco: Former  Building services engineer status: Never Used  Substance Use Topics   Alcohol use: Yes    Comment: rarely   Drug use: Not Currently    Types: Marijuana, Cocaine, Methamphetamines, IV     Allergies   Patient has no known allergies.   Review of Systems Review of Systems  Constitutional:  Positive for fatigue. Negative for chills and fever.  HENT:  Positive for congestion, postnasal drip, rhinorrhea and sinus pressure. Negative for ear pain and sore throat.   Respiratory:  Positive for cough. Negative for shortness of breath.   Cardiovascular:  Negative for palpitations.     Physical Exam Triage Vital Signs ED Triage Vitals  Encounter Vitals Group     BP      Systolic BP  Percentile      Diastolic BP Percentile      Pulse      Resp      Temp      Temp src      SpO2      Weight      Height      Head Circumference      Peak Flow      Pain Score      Pain Loc      Pain Education      Exclude from Growth Chart    No data found.  Updated Vital Signs BP (!) 137/90   Pulse 83   Temp 98 F (36.7 C)   Resp 18   SpO2 93%   Visual Acuity Right Eye Distance:   Left Eye Distance:   Bilateral Distance:    Right Eye Near:   Left Eye Near:    Bilateral Near:     Physical Exam Vitals and nursing note reviewed.  Constitutional:      General: He is not in acute distress.     Appearance: He is well-developed.  HENT:     Right Ear: Tympanic membrane normal.     Left Ear: Tympanic membrane normal.     Nose: Congestion present.     Mouth/Throat:     Mouth: Mucous membranes are moist.     Pharynx: Oropharynx is clear.  Cardiovascular:     Rate and Rhythm: Normal rate and regular rhythm.     Heart sounds: Normal heart sounds.  Pulmonary:     Effort: Pulmonary effort is normal. No respiratory distress.     Breath sounds: Rhonchi present.     Comments: Coarse rhonchi throughout. Musculoskeletal:     Cervical back: Neck supple.  Skin:    General: Skin is warm and dry.  Neurological:     Mental Status: He is alert.  Psychiatric:        Mood and Affect: Mood normal.        Behavior: Behavior normal.      UC Treatments / Results  Labs (all labs ordered are listed, but only abnormal results are displayed) Labs Reviewed - No data to display  EKG   Radiology DG Chest 2 View  Result Date: 08/25/2022 CLINICAL DATA:  Productive cough EXAM: CHEST - 2 VIEW COMPARISON:  02/02/2016 FINDINGS: Normal cardiac silhouette. RIGHT lower lobe platelike opacities. Thickening over the diaphragm. LEFT lung clear. No pneumothorax. No acute osseous abnormality. IMPRESSION: RIGHT lower lobe pneumonia versus pulmonary mass. Followup PA and lateral chest X-ray is recommended in 3-4 weeks following trial of antibiotic therapy to ensure resolution and exclude underlying malignancy. Electronically Signed   By: Genevive Bi M.D.   On: 08/25/2022 09:19    Procedures Procedures (including critical care time)  Medications Ordered in UC Medications - No data to display  Initial Impression / Assessment and Plan / UC Course  I have reviewed the triage vital signs and the nursing notes.  Pertinent labs & imaging results that were available during my care of the patient were reviewed by me and considered in my medical decision making (see chart for details).    Right lower lobe  pneumonia, productive cough, elevated blood pressure reading with hypertension.  Chest x-ray shows right lower lobe pneumonia versus pulmonary mass.  Treating with Augmentin and Zithromax.  Discussed x-ray finding.  Instructed patient to follow-up with his PCP for a repeat chest x-ray in 3 to 4 weeks as  recommended by radiologist.  His PCP is in [place]; he already has an appointment scheduled in 2 weeks for his yearly physical.  ED precautions discussed.  Education provided on pneumonia.  Also discussed with patient that his blood pressure is mildly elevated today and needs to be rechecked by his PCP.  He agrees to plan of care.  Final Clinical Impressions(s) / UC Diagnoses   Final diagnoses:  Productive cough  Pneumonia of right lower lobe due to infectious organism  Elevated blood pressure reading in office with diagnosis of hypertension     Discharge Instructions      Take the Augmentin and Zithromax as directed.  You need to have a repeat chest x-ray in 3 to 4 weeks.  Go to the emergency department if you have worsening symptoms.   Your blood pressure is elevated today at 137/90.  Please have this rechecked by your primary care provider in 2-4 weeks.          ED Prescriptions     Medication Sig Dispense Auth. Provider   amoxicillin-clavulanate (AUGMENTIN) 875-125 MG tablet Take 1 tablet by mouth every 12 (twelve) hours. 14 tablet Mickie Bail, NP   azithromycin (ZITHROMAX) 250 MG tablet Take 1 tablet (250 mg total) by mouth daily. Take first 2 tablets together, then 1 every day until finished. 6 tablet Mickie Bail, NP      PDMP not reviewed this encounter.   Mickie Bail, NP 08/25/22 [phone]

## 2022-08-25 NOTE — ED Triage Notes (Signed)
Patient to Urgent Care with complaints of chest congestion/ generalized body aches/ fatigue. Cough but unable to produce anything. Denies any known fevers.   Reports symptoms started over one week ago  Using an albuterol inhaler from a previous prescription. Also taking mucinex.

## 2022-10-04 ENCOUNTER — Ambulatory Visit
Admission: EM | Admit: 2022-10-04 | Discharge: 2022-10-04 | Disposition: A | Discharge status: Home / Self Care | Payer: Medicaid Other | Attending: Family Medicine | Admitting: Family Medicine

## 2022-10-04 ENCOUNTER — Ambulatory Visit (INDEPENDENT_AMBULATORY_CARE_PROVIDER_SITE_OTHER): Payer: Medicaid Other

## 2022-10-04 DIAGNOSIS — J441 Chronic obstructive pulmonary disease with (acute) exacerbation: Secondary | ICD-10-CM | POA: Diagnosis not present

## 2022-10-04 DIAGNOSIS — R052 Subacute cough: Secondary | ICD-10-CM

## 2022-10-04 DIAGNOSIS — R062 Wheezing: Secondary | ICD-10-CM

## 2022-10-04 MED ORDER — ALBUTEROL SULFATE HFA 108 (90 BASE) MCG/ACT IN AERS: 2.0000 | INHALATION_SPRAY | RESPIRATORY_TRACT | 0 refills | Status: AC | PRN

## 2022-10-04 MED ORDER — BENZONATATE 100 MG PO CAPS: 100.0000 mg | ORAL_CAPSULE | Freq: Three times a day (TID) | ORAL | 0 refills | Status: AC | PRN

## 2022-10-04 MED ORDER — PREDNISONE 20 MG PO TABS: 40.0000 mg | ORAL_TABLET | Freq: Every day | ORAL | 0 refills | Status: AC

## 2022-10-04 MED ORDER — DOXYCYCLINE HYCLATE 100 MG PO CAPS: 100.0000 mg | ORAL_CAPSULE | Freq: Two times a day (BID) | ORAL | 0 refills | Status: AC

## 2022-10-04 NOTE — ED Provider Notes (Signed)
Karl Adkins    CSN: [id number] Arrival date & time: 10/04/22  1625      History   Chief Complaint Chief Complaint  Patient presents with   Generalized Body Aches   Cough    HPI Karl Adkins is a 47 y.o. male.    Cough Here for cough and congestion and wheezing.  Symptoms began again about 2 weeks ago and have been getting more prominent in the last few days.  He also began having bodyaches about 4 days ago.  No nasal congestion and no fever or chills.  The cough is productive of some green sputum now.  He was seen here August 10 and had a right lower lobe pneumonia versus a mass on his x-ray.  He did subsequently see his primary care and had a repeat chest x-ray ordered, but he had not gotten that accomplished yet.  He did finish his Augmentin and Z-Pak which were prescribed at that visit and he did feel better until about 2 weeks ago.  He does smoke tobacco.  He has been prescribed an albuterol inhaler previously.  Past Medical History:  Diagnosis Date   Cocaine abuse (HCC)    Hypertension    Polysubstance abuse [facility])     Patient Active Problem List   Diagnosis Date Noted   Rhabdomyolysis 01/16/2016   AKI (acute kidney injury) (HCC) 01/16/2016   Lactic acidosis 01/16/2016   Cocaine abuse (HCC) 01/16/2016    Past Surgical History:  Procedure Laterality Date   NO PAST SURGERIES     TIBIA FRACTURE SURGERY Left 1996   WRIST SURGERY Right 2003       Home Medications    Prior to Admission medications   Medication Sig Start Date End Date Taking? Authorizing Provider  albuterol (VENTOLIN HFA) 108 (90 Base) MCG/ACT inhaler Inhale 2 puffs into the lungs every 4 (four) hours as needed for wheezing or shortness of breath. 10/04/22  Yes Denissa Cozart, Janace Aris, MD  benzonatate (TESSALON) 100 MG capsule Take 1 capsule (100 mg total) by mouth 3 (three) times daily as needed for cough. 10/04/22  Yes Zenia Resides, MD  doxycycline (VIBRAMYCIN) 100 MG  capsule Take 1 capsule (100 mg total) by mouth 2 (two) times daily for 7 days. 10/04/22 10/11/22 Yes Elisabetta Mishra, Janace Aris, MD  predniSONE (DELTASONE) 20 MG tablet Take 2 tablets (40 mg total) by mouth daily with breakfast for 5 days. 10/04/22 10/09/22 Yes Zenia Resides, MD  amLODipine (NORVASC) 5 MG tablet TAKE ONE TABLET BY MOUTH EVERY DAY Patient not taking: Reported on 08/25/2022 07/14/18   Iloabachie, Chioma E, NP  atorvastatin (LIPITOR) 40 MG tablet Take 1 tablet by mouth daily. 08/03/22 08/03/23  [provider]  hydrochlorothiazide (HYDRODIURIL) 25 MG tablet Take 1 tablet by mouth daily. Patient not taking: Reported on 08/25/2022 07/12/22   [provider]  lisinopril (PRINIVIL,ZESTRIL) 20 MG tablet Take 0.5 tablets (10 mg total) by mouth daily. 04/01/18   Iloabachie, Chioma E, NP  tamsulosin (FLOMAX) 0.4 MG CAPS capsule Take 1 capsule by mouth daily. 08/02/22 08/02/23  [provider]    Family History Family History  Family history unknown: Yes    Social History Social History   Tobacco Use   Smoking status: Every Day    Types: Cigarettes   Smokeless tobacco: Former  Building services engineer status: Never Used  Substance Use Topics   Alcohol use: Yes    Comment: rarely   Drug use: Not  Currently    Types: Marijuana, Cocaine, Methamphetamines, IV     Allergies   Patient has no known allergies.   Review of Systems Review of Systems  Respiratory:  Positive for cough.      Physical Exam Triage Vital Signs ED Triage Vitals  Encounter Vitals Group     BP 10/04/22 1640 132/82     Systolic BP Percentile --      Diastolic BP Percentile --      Pulse Rate 10/04/22 1640 90     Resp 10/04/22 1640 20     Temp 10/04/22 1640 98.1 F (36.7 C)     Temp src --      SpO2 10/04/22 1640 97 %     Weight --      Height --      Head Circumference --      Peak Flow --      Pain Score 10/04/22 1639 2     Pain Loc --      Pain Education --      Exclude from  Growth Chart --    No data found.  Updated Vital Signs BP 132/82   Pulse 90   Temp 98.1 F (36.7 C)   Resp 20   SpO2 97%   Visual Acuity Right Eye Distance:   Left Eye Distance:   Bilateral Distance:    Right Eye Near:   Left Eye Near:    Bilateral Near:     Physical Exam Vitals reviewed.  Constitutional:      General: He is not in acute distress.    Appearance: He is not toxic-appearing.  HENT:     Nose: Nose normal.     Mouth/Throat:     Mouth: Mucous membranes are moist.     Pharynx: No oropharyngeal exudate or posterior oropharyngeal erythema.  Eyes:     Extraocular Movements: Extraocular movements intact.     Conjunctiva/sclera: Conjunctivae normal.     Pupils: Pupils are equal, round, and reactive to light.  Cardiovascular:     Rate and Rhythm: Normal rate and regular rhythm.     Heart sounds: No murmur heard. Pulmonary:     Effort: No respiratory distress.     Breath sounds: No stridor. No rhonchi or rales.     Comments: There are low pitched expiratory wheezes bilaterally with good air movement. Musculoskeletal:     Cervical back: Neck supple.  Lymphadenopathy:     Cervical: No cervical adenopathy.  Skin:    Capillary Refill: Capillary refill takes less than 2 seconds.     Coloration: Skin is not jaundiced or pale.  Neurological:     General: No focal deficit present.     Mental Status: He is alert and oriented to person, place, and time.  Psychiatric:        Behavior: Behavior normal.      UC Treatments / Results  Labs (all labs ordered are listed, but only abnormal results are displayed) Labs Reviewed - No data to display  EKG   Radiology DG Chest 2 View  Result Date: 10/04/2022 CLINICAL DATA:  cough/congestion x 2 weeks, now with aches. RLL pneumonia vs mass on cxr 08/25/22 EXAM: CHEST - 2 VIEW COMPARISON:  August 10th 2024 FINDINGS: The cardiomediastinal silhouette is normal in contour. No pleural effusion. No pneumothorax. Mild diffuse  bronchitic markings. Previously described masslike area of the RIGHT lower and middle lobe is no longer discretely visualized. Visualized abdomen is unremarkable. No acute osseous  abnormality noted. IMPRESSION: 1. Diffuse bronchitic markings could reflect underlying small airways disease. 2. Previously described masslike area of the RIGHT lower and middle lobe is no longer discretely visualized. Electronically Signed   By: Meda Klinefelter M.D.   On: 10/04/2022 17:31    Procedures Procedures (including critical care time)  Medications Ordered in UC Medications - No data to display  Initial Impression / Assessment and Plan / UC Course  I have reviewed the triage vital signs and the nursing notes.  Pertinent labs & imaging results that were available during my care of the patient were reviewed by me and considered in my medical decision making (see chart for details).       Chest x-ray does not show any infiltrate at this time.  The abnormalities from previous have resolved.  Doxycycline is sent in for possible bronchitis and COPD exacerbation.  Albuterol and prednisone are also sent in for exacerbation.    Final Clinical Impressions(s) / UC Diagnoses   Final diagnoses:  Subacute cough  Wheezing  COPD exacerbation [facility])     Discharge Instructions      Your chest x-ray did not show any pneumonia.  The abnormalities that were there in early August have resolved.  Take doxycycline 100 mg --1 capsule 2 times daily for 7 days  Take prednisone 20 mg--2 daily for 5 days  Albuterol inhaler--do 2 puffs every 4 hours as needed for shortness of breath or wheezing  Take benzonatate 100 mg, 1 tab every 8 hours as needed for cough.      ED Prescriptions     Medication Sig Dispense Auth. Provider   doxycycline (VIBRAMYCIN) 100 MG capsule Take 1 capsule (100 mg total) by mouth 2 (two) times daily for 7 days. 14 capsule Zenia Resides, MD   predniSONE (DELTASONE) 20 MG tablet  Take 2 tablets (40 mg total) by mouth daily with breakfast for 5 days. 10 tablet Zenia Resides, MD   benzonatate (TESSALON) 100 MG capsule Take 1 capsule (100 mg total) by mouth 3 (three) times daily as needed for cough. 21 capsule Zenia Resides, MD   albuterol (VENTOLIN HFA) 108 (90 Base) MCG/ACT inhaler Inhale 2 puffs into the lungs every 4 (four) hours as needed for wheezing or shortness of breath. 1 each Zenia Resides, MD      PDMP not reviewed this encounter.   Zenia Resides, MD 10/04/22 [phone]

## 2022-10-04 NOTE — Discharge Instructions (Addendum)
Your chest x-ray did not show any pneumonia.  The abnormalities that were there in early August have resolved.  Take doxycycline 100 mg --1 capsule 2 times daily for 7 days  Take prednisone 20 mg--2 daily for 5 days  Albuterol inhaler--do 2 puffs every 4 hours as needed for shortness of breath or wheezing  Take benzonatate 100 mg, 1 tab every 8 hours as needed for cough.

## 2022-10-04 NOTE — ED Triage Notes (Signed)
Patient to Urgent Care with complaints of recurrent body aches x4 days and cough x2 weeks. Cough is productive w/ green phlegm.   Treated for RLL pneumonia 8/10. Felt better after treatment. Gradually started feeling bad again.   Every day cigarette smoker. Denies any fevers.
